# Patient Record
Sex: Female | Born: 1940 | Race: Black or African American | Hispanic: No | Marital: Single | Smoking: Never smoker
Health system: Southern US, Community
[De-identification: ages and names within clinical notes are randomized; demographics above are authoritative.]

## PROBLEM LIST (undated history)

## (undated) DIAGNOSIS — M545 Low back pain, unspecified: Secondary | ICD-10-CM

## (undated) DIAGNOSIS — I10 Essential (primary) hypertension: Secondary | ICD-10-CM

## (undated) DIAGNOSIS — M5430 Sciatica, unspecified side: Secondary | ICD-10-CM

## (undated) DIAGNOSIS — R002 Palpitations: Secondary | ICD-10-CM

## (undated) DIAGNOSIS — E7800 Pure hypercholesterolemia, unspecified: Secondary | ICD-10-CM

## (undated) DIAGNOSIS — R2689 Other abnormalities of gait and mobility: Secondary | ICD-10-CM

## (undated) DIAGNOSIS — M7582 Other shoulder lesions, left shoulder: Secondary | ICD-10-CM

## (undated) HISTORY — PX: ABDOMINAL HYSTERECTOMY: SHX81

---

## 2005-10-17 ENCOUNTER — Ambulatory Visit (HOSPITAL_COMMUNITY): Admission: RE | Admit: 2005-10-17 | Discharge: 2005-10-17 | Payer: Self-pay | Admitting: General Surgery

## 2005-12-18 ENCOUNTER — Ambulatory Visit (HOSPITAL_COMMUNITY): Admission: RE | Admit: 2005-12-18 | Discharge: 2005-12-18 | Payer: Self-pay | Admitting: Family Medicine

## 2006-01-06 ENCOUNTER — Ambulatory Visit: Payer: Self-pay | Admitting: Orthopedic Surgery

## 2006-02-17 ENCOUNTER — Ambulatory Visit: Payer: Self-pay | Admitting: Orthopedic Surgery

## 2006-03-04 ENCOUNTER — Ambulatory Visit (HOSPITAL_COMMUNITY): Admission: RE | Admit: 2006-03-04 | Discharge: 2006-03-04 | Payer: Self-pay | Admitting: Orthopedic Surgery

## 2006-08-18 ENCOUNTER — Ambulatory Visit: Payer: Self-pay | Admitting: Cardiology

## 2007-06-30 IMAGING — CR DG KNEE COMPLETE 4+V*R*
4 series · 4 of 4 positions shown · non-contrast
Comparison: none

HISTORY: Bilateral knee pain

LEFT KNEE 4 VIEWS:
Minimal joint space narrowing.
No fracture, dislocation, or bone destruction.
Patellar spur formation at quadriceps tendon and patellar tendon insertions.
Minimal spur formation lateral compartment.
No joint effusion.

[view not recorded (1 of 4)]
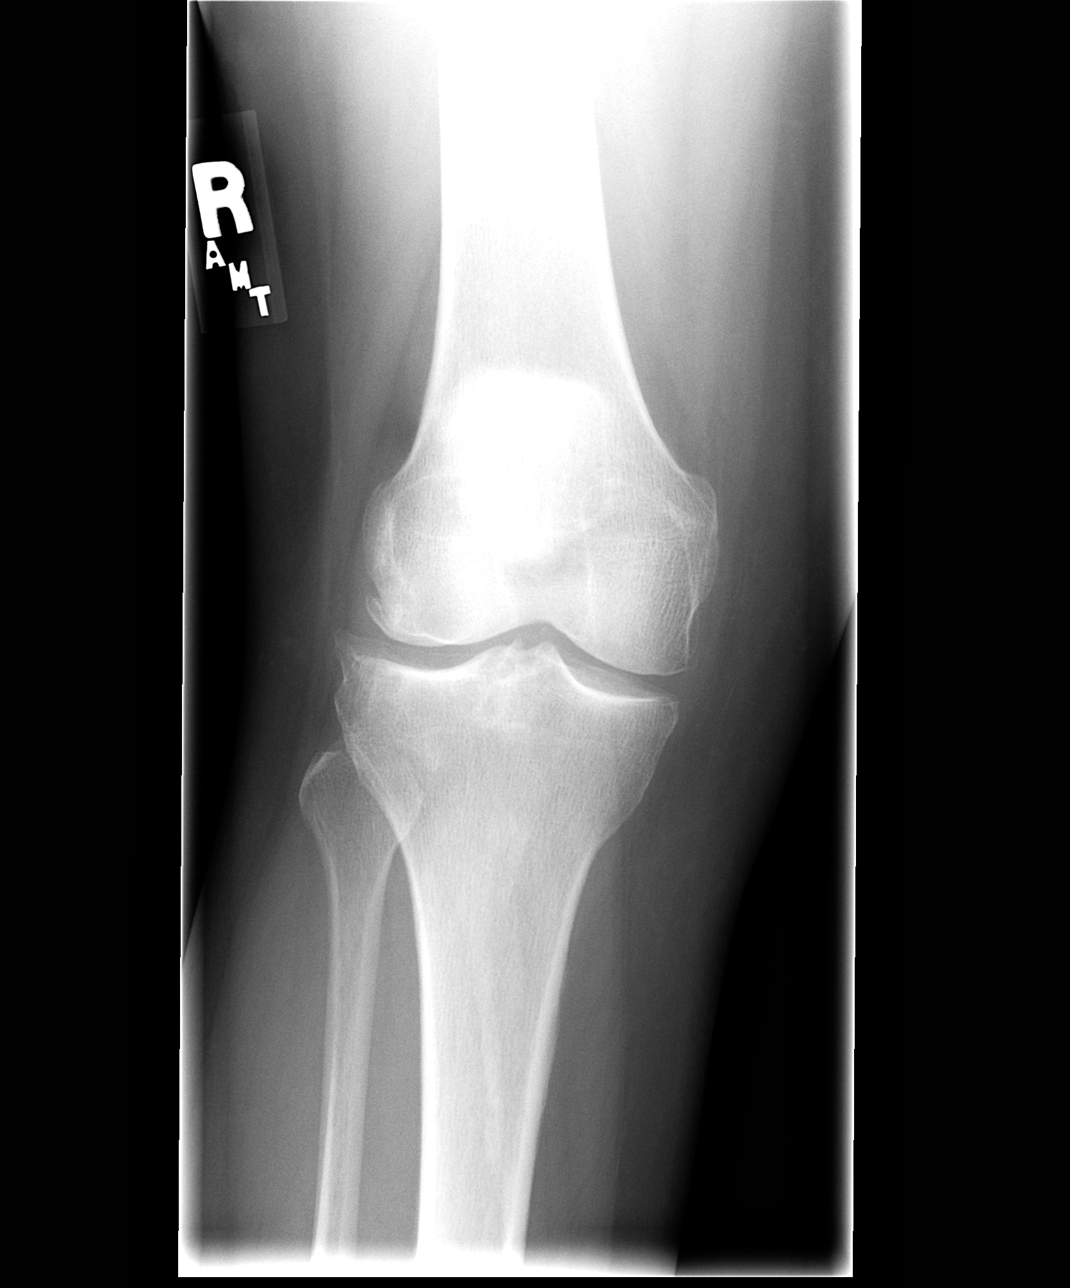

[view not recorded (2 of 4)]
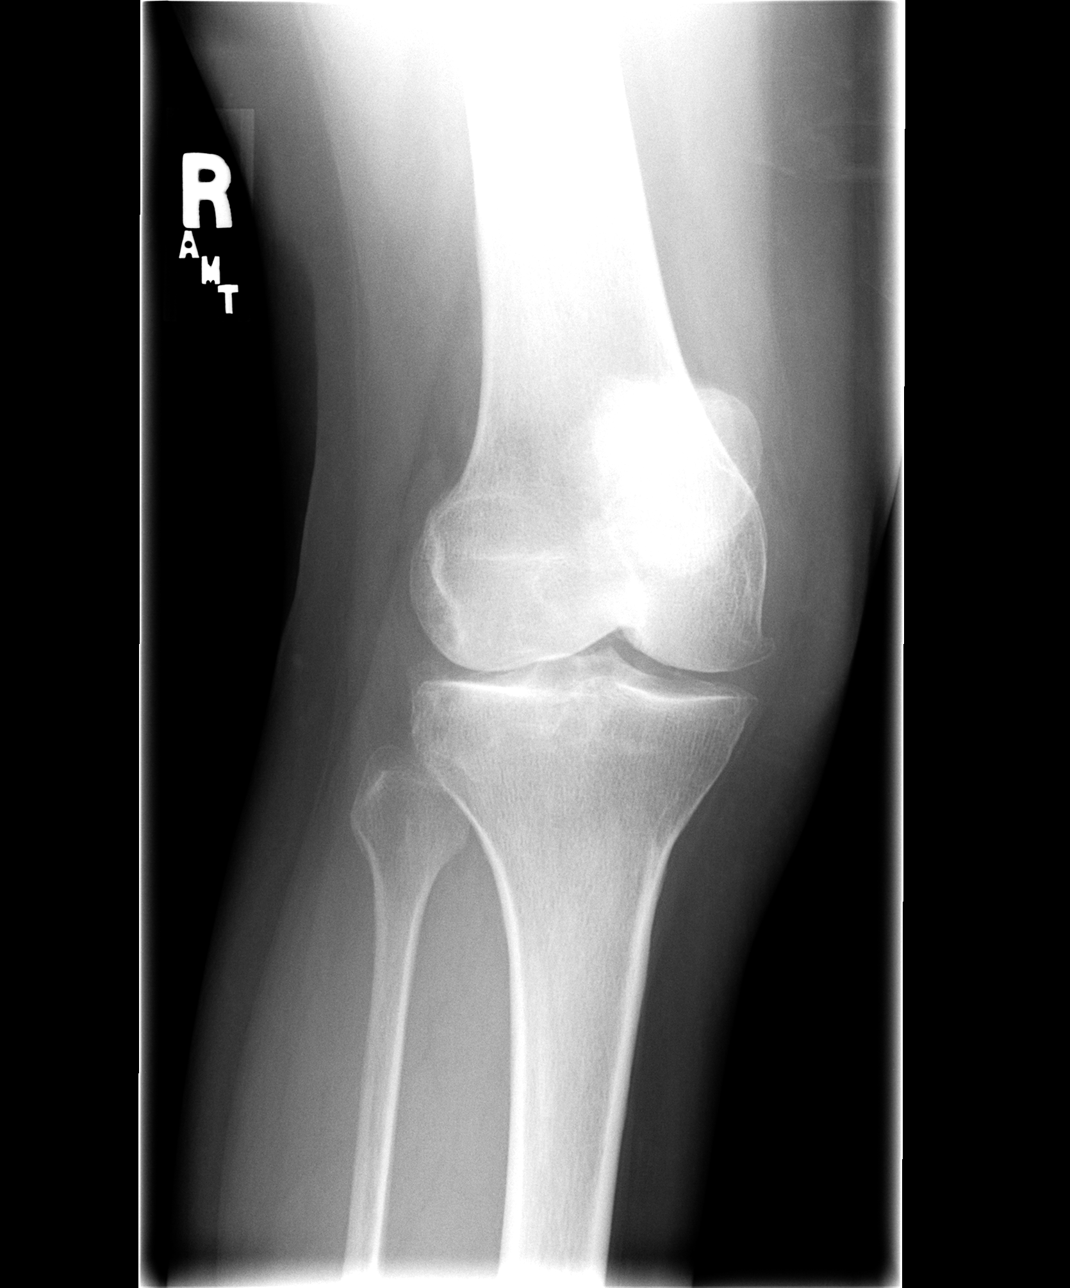

[view not recorded (3 of 4)]
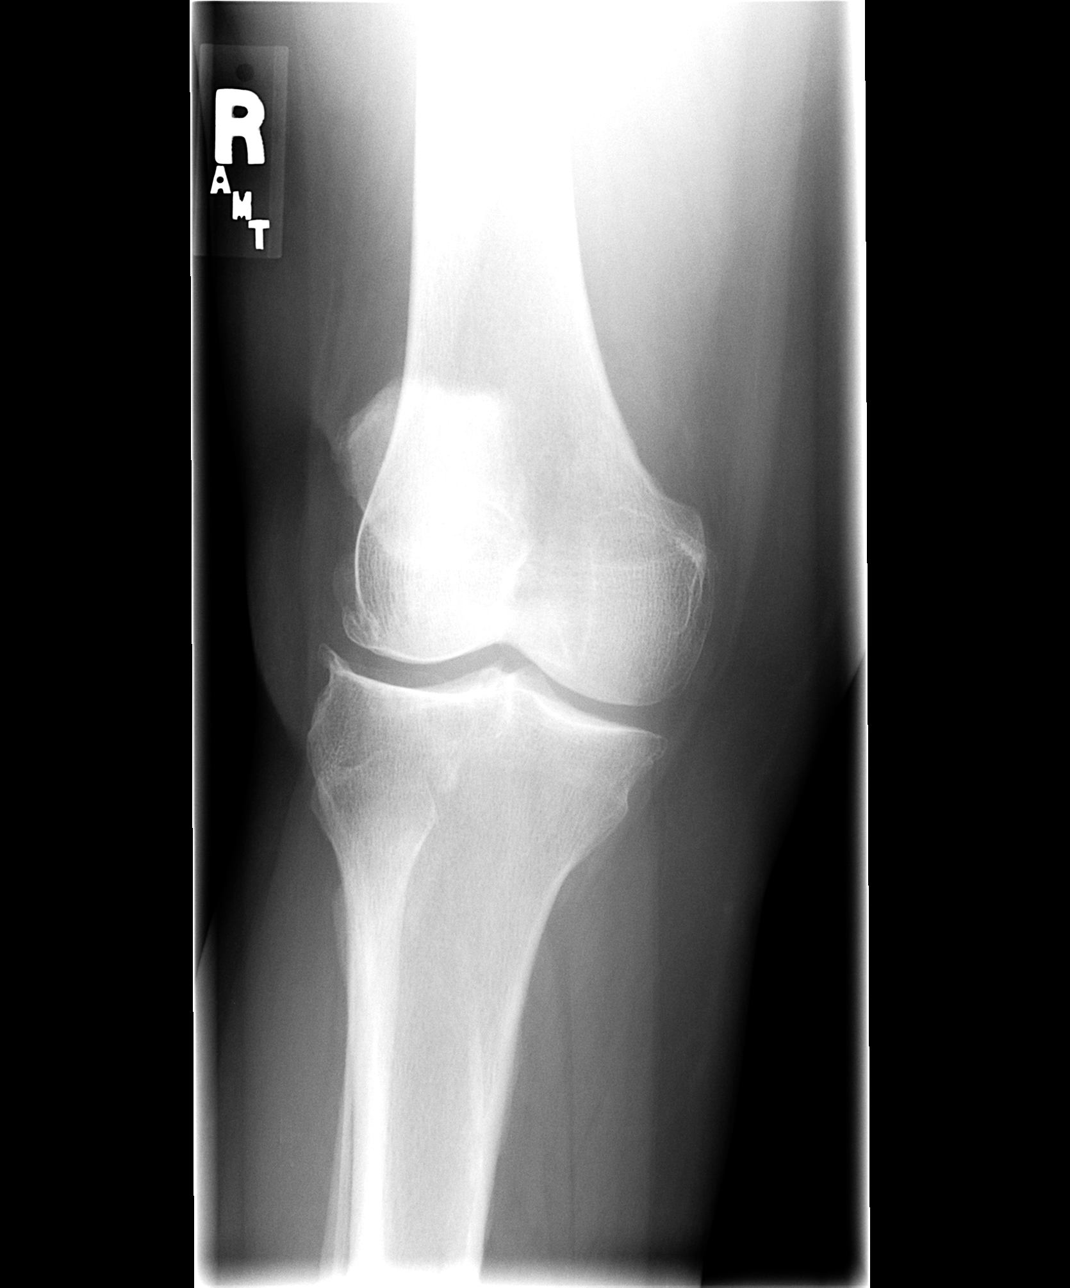

[view not recorded (4 of 4)]
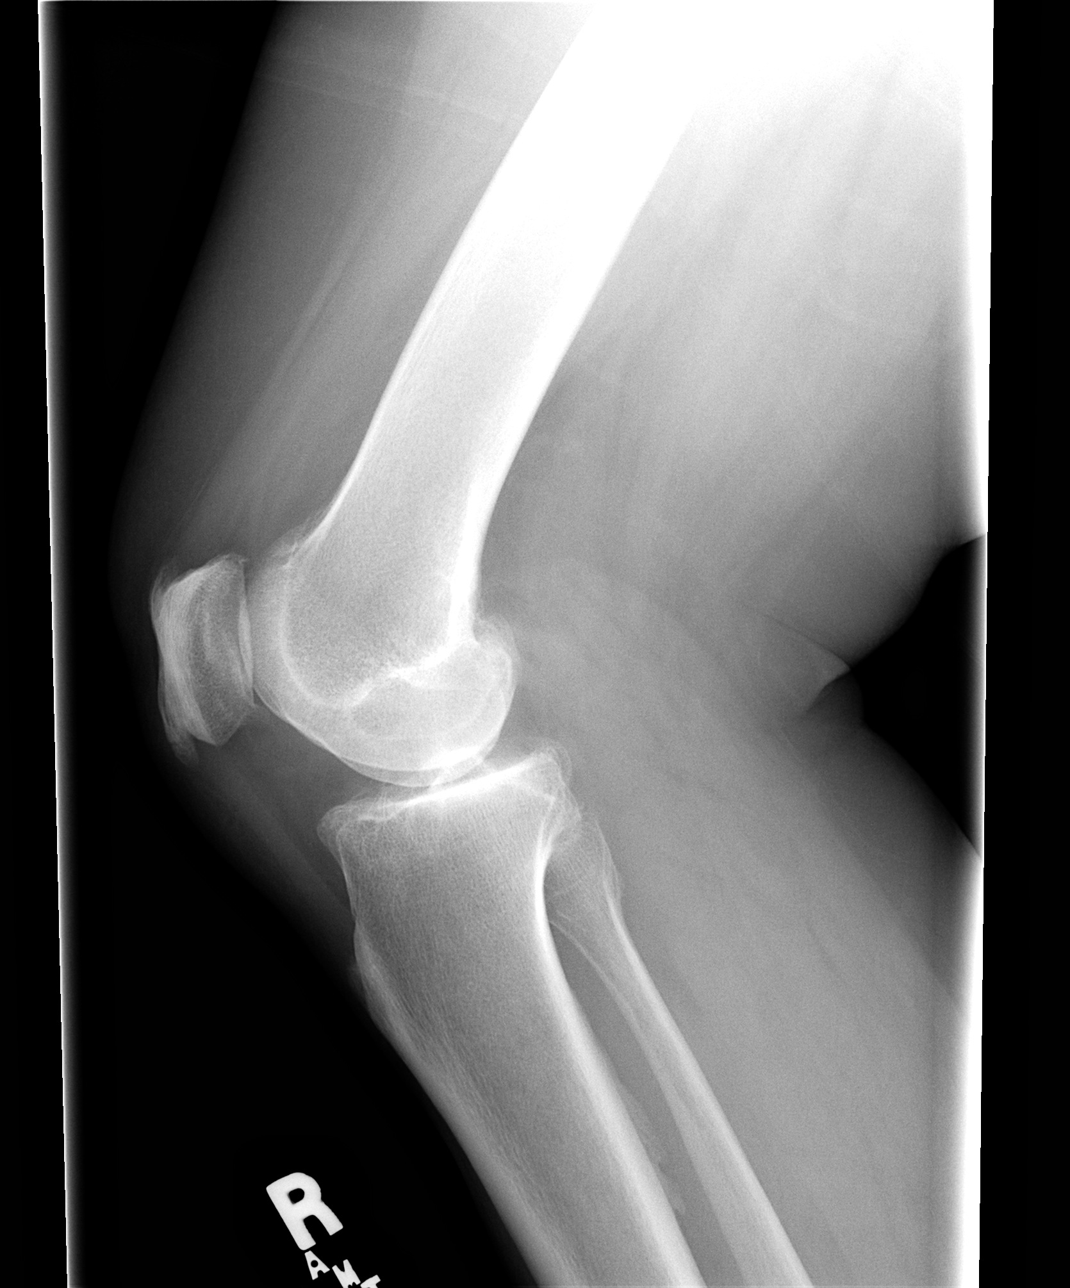

[4 of 4 positions shown; findings below may reference images not displayed]

IMPRESSION: Minimal degenerative changes left knee.

Right knee 4 views:

Osteoarthritic changes primarily at lateral compartment and patellofemoral
joint.
Patellar spur formation at quadriceps and patellar tendon insertions.
No fracture, dislocation or bone destruction.
No joint effusion seen.
IMPRESSION: Osteoarthritic changes right knee.

## 2007-09-24 ENCOUNTER — Encounter: Admission: RE | Admit: 2007-09-24 | Discharge: 2007-09-24 | Payer: Self-pay | Admitting: Orthopedic Surgery

## 2008-08-17 ENCOUNTER — Ambulatory Visit (HOSPITAL_COMMUNITY): Admission: RE | Admit: 2008-08-17 | Discharge: 2008-08-18 | Payer: Self-pay | Admitting: Orthopedic Surgery

## 2010-02-26 IMAGING — CR DG CHEST 2V
2 series · 2 of 2 positions shown · non-contrast
Comparison: None

CLINICAL DATA: Shoulder impingement and labral tear.  Preop
respiratory exam.

CHEST - 2 VIEW

[view not recorded (1 of 2)]
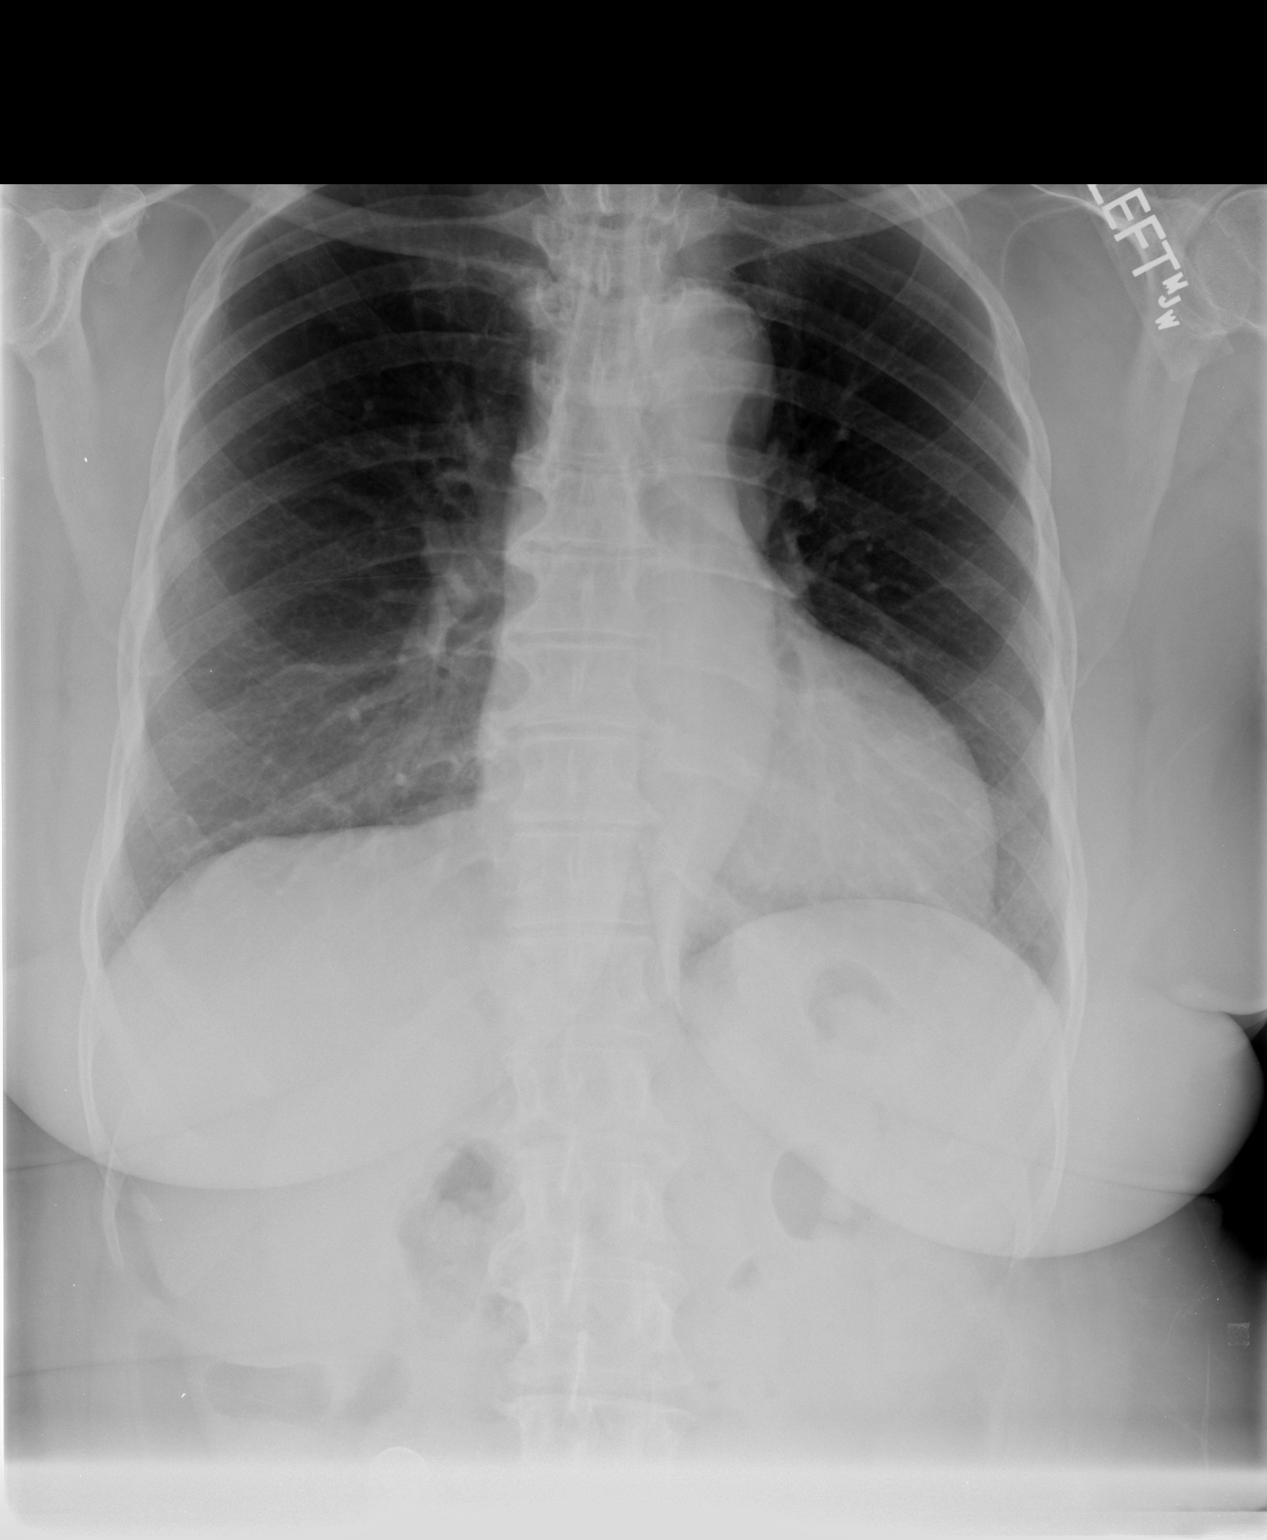

[view not recorded (2 of 2)]
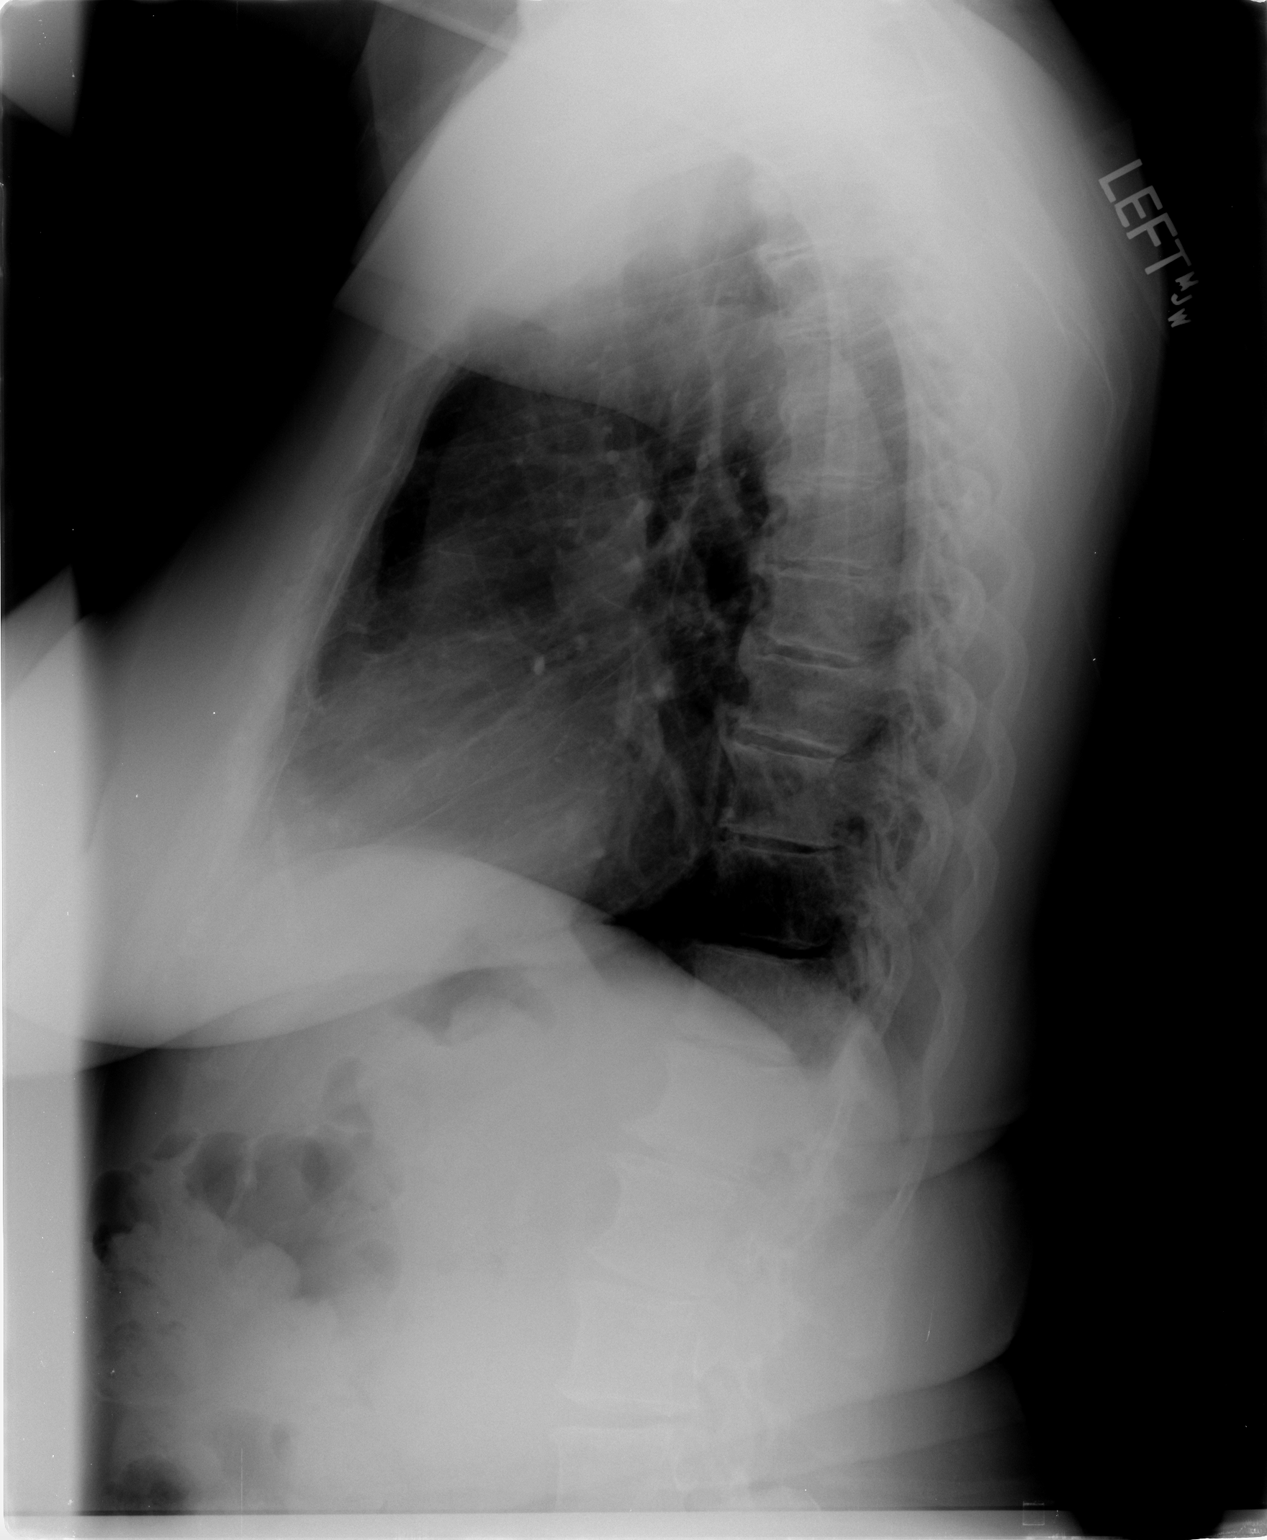

[2 of 2 positions shown; findings below may reference images not displayed]

FINDINGS: Mild cardiomegaly is noted.  Ectasia of the thoracic
aorta seen.  Both lungs are clear.  There is no evidence of pleural
effusion. No mass or adenopathy identified.
IMPRESSION: Mild cardiomegaly. No active disease.

## 2010-08-05 ENCOUNTER — Encounter: Payer: Self-pay | Admitting: Orthopedic Surgery

## 2010-10-30 LAB — CBC
Hemoglobin: 13.2 g/dL (ref 12.0–15.0)
MCV: 87.8 fL (ref 78.0–100.0)
Platelets: 296 10*3/uL (ref 150–400)
RBC: 4.63 MIL/uL (ref 3.87–5.11)
RDW: 16.5 % — ABNORMAL HIGH (ref 11.5–15.5)
WBC: 10.9 10*3/uL — ABNORMAL HIGH (ref 4.0–10.5)

## 2010-10-30 LAB — BASIC METABOLIC PANEL
Chloride: 103 mEq/L (ref 96–112)
Creatinine, Ser: 0.84 mg/dL (ref 0.4–1.2)
GFR calc non Af Amer: >60 mL/min (ref >60)
Glucose, Bld: 93 mg/dL (ref 70–99)
Sodium: 142 mEq/L (ref 135–145)

## 2010-10-30 LAB — URINALYSIS, ROUTINE W REFLEX MICROSCOPIC
Bilirubin Urine: NEGATIVE
Glucose, UA: NEGATIVE mg/dL
Hgb urine dipstick: NEGATIVE
Nitrite: NEGATIVE
Protein, ur: NEGATIVE mg/dL
pH: 7 (ref 5.0–8.0)

## 2010-10-30 LAB — URINE MICROSCOPIC-ADD ON

## 2010-11-27 NOTE — Op Note (Signed)
NAMEDECHELLE, ATTAWAY NO.:  [id number]   MEDICAL RECORD NO.:  [id number]          PATIENT TYPE:  AMB   LOCATION:  SDS                          FACILITY:  [facility]   PHYSICIAN:  Myrtie Neither, MD      DATE OF BIRTH:  [birth year]   DATE OF PROCEDURE:  08/17/2008  DATE OF DISCHARGE:                               OPERATIVE REPORT   PREOPERATIVE DIAGNOSES:  Impingement syndrome, right shoulder; labral  tear, right shoulder.   POSTOPERATIVE DIAGNOSES:  Loose bodies, right shoulder; degenerative  labral tear, right shoulder; impingement syndrome, right shoulder; and  synovitis, right shoulder.   ANESTHESIA:  General.   PROCEDURES:  1. Arthroscopic removal of loose bodies, right shoulder and      debridement of labrum.  2. Arthroscopic acromioplasty, decompression, and synovectomy, right      shoulder.   The patient was taken to the operating room and was given adequate preop  medications, given general anesthesia and was intubated.  Right shoulder  was prepped with DuraPrep and draped in a sterile manner.  The patient  was placed in barber-chair position.  A 1/2-inch puncture wound made  along the posterior aspect of the shoulder.  A switching rod was placed  in posterior to anterior.  Anterior inflow water incision was made.  Separate lateral incision was made with the shaver as well as  posterolaterally.  Inspection of the joint itself was done first.  Inspection revealed degenerative tear of the labrum approximately 12 and  1 o'clock position.  There were multiple loose osteochondral bodies  inside the joint.  The articular surface of the humeral head had some  chondral defect, the most part well preserved.  The glenoid itself had  some early degenerative joint changes.  With the use of the synovial  shaver, loose bodies were removed from the joint itself and debridement  along the edges of the labrum.  Next, inspection of the subacromial  space was done.  Rotator  cuff revealed an significant tendopathy, but no  full-thickness tear of the rotator cuff.  Complete synovectomy was done  with acromioplasty with use of a bur.  After adequate decompression and  acromioplasty, wound closure was then done with 4-0 nylon, 14 mL of 1%  plain Marcaine was injected into the area.  Compressive dressing was  applied and sling applied.  The patient tolerated procedure quite well  and taken to recovery room in stable and satisfactory condition.  The  patient being kept 23 hours for observation and pain control.  The  patient will be discharged on Percocet 1 to 2 q.6 h. p.r.n. for pain.  Continue ice packs and to return to the office in 1 week.      Myrtie Neither, MD  Electronically Signed     AC/MEDQ  D:  08/17/2008  T:  08/17/2008  Job:  [id number]

## 2010-11-30 NOTE — H&P (Signed)
Abigail Frazier, Abigail Frazier           ACCOUNT NO.:  [id number]   MEDICAL RECORD NO.:  [id number]          PATIENT TYPE:  AMB   LOCATION:                                FACILITY:  [facility]   PHYSICIAN:  Dirk Dress. Katrinka Blazing, M.D.   DATE OF BIRTH:  06-18-1941   DATE OF ADMISSION:  DATE OF DISCHARGE:  LH                                HISTORY & PHYSICAL   70 year old female with a history of dysphagia to solid and breads,  difficulty with liquids, she has been symptomatic for over two months.  She  has had heartburn for many years which she had treated with over-the-counter  medications.  She denies nausea and vomiting, there has been no weight loss  and no abdominal pain.   PAST MEDICAL HISTORY:  Hypertension, glaucoma, gastroesophageal reflux  disease.   MEDICATIONS:  Lisinopril/HCT 20/12.5 daily, Reglan 10 mg a.c. and h.s.,  Prevacid 30 mg daily.   PAST SURGICAL HISTORY:  Thyroidectomy, hysterectomy, appendectomy.   PHYSICAL EXAMINATION:  VITAL SIGNS:  Blood pressure 150/92, pulse 62, respirations 20, weight 166  pounds.  HEENT:  Unremarkable.  NECK:  Supple, no JVD, bruit, adenopathy, or thyromegaly.  CHEST:  Clear to auscultation.  HEART:  Regular rate and rhythm without murmurs, gallops, and rubs.  ABDOMEN:  Soft, nontender, no masses.  EXTREMITIES:  No cyanosis, clubbing, and edema.  NEUROLOGICAL:  No focal motor, sensory, or cerebellar deficit.   IMPRESSION:  1.  Gastroesophageal reflux disease.  2.  History suggestive of esophagitis.  3.  Progressive dysphagia.  4.  Hypertension.  5.  Glaucoma.   PLAN:  Upper endoscopy with possible esophageal dilatation.      Dirk Dress. Katrinka Blazing, M.D.  Electronically Signed    LCS/MEDQ  D:  10/16/2005  T:  10/16/2005  Job:  [id number]

## 2012-11-11 ENCOUNTER — Ambulatory Visit (INDEPENDENT_AMBULATORY_CARE_PROVIDER_SITE_OTHER): Payer: 59 | Admitting: *Deleted

## 2012-11-11 DIAGNOSIS — I4892 Unspecified atrial flutter: Secondary | ICD-10-CM

## 2014-09-13 DIAGNOSIS — I10 Essential (primary) hypertension: Secondary | ICD-10-CM | POA: Diagnosis not present

## 2014-09-13 DIAGNOSIS — E785 Hyperlipidemia, unspecified: Secondary | ICD-10-CM | POA: Diagnosis not present

## 2014-09-13 DIAGNOSIS — M1711 Unilateral primary osteoarthritis, right knee: Secondary | ICD-10-CM | POA: Diagnosis not present

## 2014-10-14 DIAGNOSIS — H4011X3 Primary open-angle glaucoma, severe stage: Secondary | ICD-10-CM | POA: Diagnosis not present

## 2014-10-14 DIAGNOSIS — H2511 Age-related nuclear cataract, right eye: Secondary | ICD-10-CM | POA: Diagnosis not present

## 2014-11-20 DIAGNOSIS — R011 Cardiac murmur, unspecified: Secondary | ICD-10-CM | POA: Diagnosis not present

## 2014-11-20 DIAGNOSIS — I10 Essential (primary) hypertension: Secondary | ICD-10-CM | POA: Diagnosis not present

## 2014-11-20 DIAGNOSIS — J9691 Respiratory failure, unspecified with hypoxia: Secondary | ICD-10-CM | POA: Diagnosis not present

## 2014-11-20 DIAGNOSIS — Z8701 Personal history of pneumonia (recurrent): Secondary | ICD-10-CM | POA: Diagnosis not present

## 2014-11-20 DIAGNOSIS — J42 Unspecified chronic bronchitis: Secondary | ICD-10-CM | POA: Diagnosis not present

## 2014-11-20 DIAGNOSIS — H409 Unspecified glaucoma: Secondary | ICD-10-CM | POA: Diagnosis not present

## 2014-11-20 DIAGNOSIS — R05 Cough: Secondary | ICD-10-CM | POA: Diagnosis not present

## 2014-11-20 DIAGNOSIS — K219 Gastro-esophageal reflux disease without esophagitis: Secondary | ICD-10-CM | POA: Diagnosis not present

## 2014-11-20 DIAGNOSIS — Z7982 Long term (current) use of aspirin: Secondary | ICD-10-CM | POA: Diagnosis not present

## 2014-11-20 DIAGNOSIS — R0602 Shortness of breath: Secondary | ICD-10-CM | POA: Diagnosis not present

## 2014-11-20 DIAGNOSIS — Z79899 Other long term (current) drug therapy: Secondary | ICD-10-CM | POA: Diagnosis not present

## 2014-11-20 DIAGNOSIS — R918 Other nonspecific abnormal finding of lung field: Secondary | ICD-10-CM | POA: Diagnosis not present

## 2014-11-20 DIAGNOSIS — J189 Pneumonia, unspecified organism: Secondary | ICD-10-CM | POA: Diagnosis not present

## 2014-11-21 DIAGNOSIS — Z7982 Long term (current) use of aspirin: Secondary | ICD-10-CM | POA: Diagnosis not present

## 2014-11-21 DIAGNOSIS — H409 Unspecified glaucoma: Secondary | ICD-10-CM | POA: Diagnosis not present

## 2014-11-21 DIAGNOSIS — Z8701 Personal history of pneumonia (recurrent): Secondary | ICD-10-CM | POA: Diagnosis not present

## 2014-11-21 DIAGNOSIS — Z79899 Other long term (current) drug therapy: Secondary | ICD-10-CM | POA: Diagnosis not present

## 2014-11-21 DIAGNOSIS — K219 Gastro-esophageal reflux disease without esophagitis: Secondary | ICD-10-CM | POA: Diagnosis not present

## 2014-11-21 DIAGNOSIS — J189 Pneumonia, unspecified organism: Secondary | ICD-10-CM | POA: Diagnosis not present

## 2014-11-21 DIAGNOSIS — I10 Essential (primary) hypertension: Secondary | ICD-10-CM | POA: Diagnosis not present

## 2014-11-21 DIAGNOSIS — J42 Unspecified chronic bronchitis: Secondary | ICD-10-CM | POA: Diagnosis not present

## 2014-11-22 DIAGNOSIS — K219 Gastro-esophageal reflux disease without esophagitis: Secondary | ICD-10-CM | POA: Diagnosis not present

## 2014-11-22 DIAGNOSIS — J42 Unspecified chronic bronchitis: Secondary | ICD-10-CM | POA: Diagnosis not present

## 2014-11-22 DIAGNOSIS — H409 Unspecified glaucoma: Secondary | ICD-10-CM | POA: Diagnosis not present

## 2014-11-22 DIAGNOSIS — Z8701 Personal history of pneumonia (recurrent): Secondary | ICD-10-CM | POA: Diagnosis not present

## 2014-11-22 DIAGNOSIS — Z79899 Other long term (current) drug therapy: Secondary | ICD-10-CM | POA: Diagnosis not present

## 2014-11-22 DIAGNOSIS — J189 Pneumonia, unspecified organism: Secondary | ICD-10-CM | POA: Diagnosis not present

## 2014-11-22 DIAGNOSIS — I10 Essential (primary) hypertension: Secondary | ICD-10-CM | POA: Diagnosis not present

## 2014-11-22 DIAGNOSIS — Z7982 Long term (current) use of aspirin: Secondary | ICD-10-CM | POA: Diagnosis not present

## 2014-12-14 DIAGNOSIS — I10 Essential (primary) hypertension: Secondary | ICD-10-CM | POA: Diagnosis not present

## 2014-12-14 DIAGNOSIS — E785 Hyperlipidemia, unspecified: Secondary | ICD-10-CM | POA: Diagnosis not present

## 2015-01-18 DIAGNOSIS — J189 Pneumonia, unspecified organism: Secondary | ICD-10-CM | POA: Diagnosis not present

## 2015-01-18 DIAGNOSIS — I10 Essential (primary) hypertension: Secondary | ICD-10-CM | POA: Diagnosis not present

## 2015-02-20 DIAGNOSIS — H4011X3 Primary open-angle glaucoma, severe stage: Secondary | ICD-10-CM | POA: Diagnosis not present

## 2015-02-20 DIAGNOSIS — H269 Unspecified cataract: Secondary | ICD-10-CM | POA: Diagnosis not present

## 2015-02-20 DIAGNOSIS — H2511 Age-related nuclear cataract, right eye: Secondary | ICD-10-CM | POA: Diagnosis not present

## 2015-02-20 DIAGNOSIS — H25811 Combined forms of age-related cataract, right eye: Secondary | ICD-10-CM | POA: Diagnosis not present

## 2015-02-27 DIAGNOSIS — Z1231 Encounter for screening mammogram for malignant neoplasm of breast: Secondary | ICD-10-CM | POA: Diagnosis not present

## 2015-06-20 DIAGNOSIS — H401133 Primary open-angle glaucoma, bilateral, severe stage: Secondary | ICD-10-CM | POA: Diagnosis not present

## 2015-06-20 DIAGNOSIS — H04123 Dry eye syndrome of bilateral lacrimal glands: Secondary | ICD-10-CM | POA: Diagnosis not present

## 2015-07-05 DIAGNOSIS — Z6830 Body mass index (BMI) 30.0-30.9, adult: Secondary | ICD-10-CM | POA: Diagnosis not present

## 2015-07-05 DIAGNOSIS — I10 Essential (primary) hypertension: Secondary | ICD-10-CM | POA: Diagnosis not present

## 2015-07-05 DIAGNOSIS — M1990 Unspecified osteoarthritis, unspecified site: Secondary | ICD-10-CM | POA: Diagnosis not present

## 2015-10-06 DIAGNOSIS — H43813 Vitreous degeneration, bilateral: Secondary | ICD-10-CM | POA: Diagnosis not present

## 2015-10-06 DIAGNOSIS — H401133 Primary open-angle glaucoma, bilateral, severe stage: Secondary | ICD-10-CM | POA: Diagnosis not present

## 2015-11-07 DIAGNOSIS — H401110 Primary open-angle glaucoma, right eye, stage unspecified: Secondary | ICD-10-CM | POA: Diagnosis not present

## 2016-02-13 DIAGNOSIS — M1711 Unilateral primary osteoarthritis, right knee: Secondary | ICD-10-CM | POA: Diagnosis not present

## 2016-02-13 DIAGNOSIS — E785 Hyperlipidemia, unspecified: Secondary | ICD-10-CM | POA: Diagnosis not present

## 2016-02-13 DIAGNOSIS — I10 Essential (primary) hypertension: Secondary | ICD-10-CM | POA: Diagnosis not present

## 2016-03-04 DIAGNOSIS — Z1231 Encounter for screening mammogram for malignant neoplasm of breast: Secondary | ICD-10-CM | POA: Diagnosis not present

## 2016-08-09 DIAGNOSIS — W010XXA Fall on same level from slipping, tripping and stumbling without subsequent striking against object, initial encounter: Secondary | ICD-10-CM | POA: Diagnosis not present

## 2016-08-09 DIAGNOSIS — I10 Essential (primary) hypertension: Secondary | ICD-10-CM | POA: Diagnosis not present

## 2016-08-09 DIAGNOSIS — Z7982 Long term (current) use of aspirin: Secondary | ICD-10-CM | POA: Diagnosis not present

## 2016-08-09 DIAGNOSIS — M1711 Unilateral primary osteoarthritis, right knee: Secondary | ICD-10-CM | POA: Diagnosis not present

## 2016-08-09 DIAGNOSIS — K219 Gastro-esophageal reflux disease without esophagitis: Secondary | ICD-10-CM | POA: Diagnosis not present

## 2016-08-09 DIAGNOSIS — Z79899 Other long term (current) drug therapy: Secondary | ICD-10-CM | POA: Diagnosis not present

## 2016-08-09 DIAGNOSIS — S8001XA Contusion of right knee, initial encounter: Secondary | ICD-10-CM | POA: Diagnosis not present

## 2016-08-09 DIAGNOSIS — M7989 Other specified soft tissue disorders: Secondary | ICD-10-CM | POA: Diagnosis not present

## 2016-08-15 DIAGNOSIS — H401131 Primary open-angle glaucoma, bilateral, mild stage: Secondary | ICD-10-CM | POA: Diagnosis not present

## 2016-08-21 DIAGNOSIS — J209 Acute bronchitis, unspecified: Secondary | ICD-10-CM | POA: Diagnosis not present

## 2016-08-21 DIAGNOSIS — R0602 Shortness of breath: Secondary | ICD-10-CM | POA: Diagnosis not present

## 2016-08-21 DIAGNOSIS — Z7982 Long term (current) use of aspirin: Secondary | ICD-10-CM | POA: Diagnosis not present

## 2016-08-21 DIAGNOSIS — K219 Gastro-esophageal reflux disease without esophagitis: Secondary | ICD-10-CM | POA: Diagnosis not present

## 2016-08-21 DIAGNOSIS — Z79899 Other long term (current) drug therapy: Secondary | ICD-10-CM | POA: Diagnosis not present

## 2016-08-21 DIAGNOSIS — R05 Cough: Secondary | ICD-10-CM | POA: Diagnosis not present

## 2016-08-21 DIAGNOSIS — I10 Essential (primary) hypertension: Secondary | ICD-10-CM | POA: Diagnosis not present

## 2016-09-05 DIAGNOSIS — H401132 Primary open-angle glaucoma, bilateral, moderate stage: Secondary | ICD-10-CM | POA: Diagnosis not present

## 2016-10-01 DIAGNOSIS — I10 Essential (primary) hypertension: Secondary | ICD-10-CM | POA: Diagnosis not present

## 2016-10-01 DIAGNOSIS — E785 Hyperlipidemia, unspecified: Secondary | ICD-10-CM | POA: Diagnosis not present

## 2016-10-02 ENCOUNTER — Telehealth: Payer: Self-pay | Admitting: *Deleted

## 2016-10-02 DIAGNOSIS — M1711 Unilateral primary osteoarthritis, right knee: Secondary | ICD-10-CM | POA: Diagnosis not present

## 2016-10-02 DIAGNOSIS — I10 Essential (primary) hypertension: Secondary | ICD-10-CM | POA: Diagnosis not present

## 2016-10-02 NOTE — Telephone Encounter (Signed)
-----   Message from Orinda Kenner sent at 10/02/2016  2:47 PM EDT ----- Monday March 26th register @ 12:45 @ [facility]tr[facility].Marina Gravel, Coralyn Mark ----- Message ----- From: Massie Maroon, CMA Sent: 10/02/2016  11:24 AM To: Terry L Goins  Morning,   Nikki asked me to send this pt info about ordering 48 holter monitor for palpitations per Dr Berdine Addison from [facility]. I can call the pt when you get it scheduled. Thank you  Farah Benish

## 2016-10-02 NOTE — Telephone Encounter (Signed)
Sherri from Dr [employer] office made aware - also pt made aware of date/time - Sherri will fax order for holter and we will forward to [place] office.

## 2016-10-07 ENCOUNTER — Ambulatory Visit (HOSPITAL_COMMUNITY)
Admission: RE | Admit: 2016-10-07 | Discharge: 2016-10-07 | Disposition: A | Discharge status: Home / Self Care | Payer: Medicare Other | Source: Ambulatory Visit | Attending: Family Medicine | Admitting: Family Medicine

## 2016-10-07 DIAGNOSIS — I4892 Unspecified atrial flutter: Secondary | ICD-10-CM | POA: Insufficient documentation

## 2016-10-07 DIAGNOSIS — R002 Palpitations: Secondary | ICD-10-CM

## 2016-10-08 DIAGNOSIS — H401133 Primary open-angle glaucoma, bilateral, severe stage: Secondary | ICD-10-CM | POA: Diagnosis not present

## 2016-11-19 DIAGNOSIS — H401133 Primary open-angle glaucoma, bilateral, severe stage: Secondary | ICD-10-CM | POA: Diagnosis not present

## 2016-12-25 DIAGNOSIS — H401133 Primary open-angle glaucoma, bilateral, severe stage: Secondary | ICD-10-CM | POA: Diagnosis not present

## 2016-12-31 DIAGNOSIS — I10 Essential (primary) hypertension: Secondary | ICD-10-CM | POA: Diagnosis not present

## 2016-12-31 DIAGNOSIS — K219 Gastro-esophageal reflux disease without esophagitis: Secondary | ICD-10-CM | POA: Diagnosis not present

## 2017-03-06 DIAGNOSIS — Z1231 Encounter for screening mammogram for malignant neoplasm of breast: Secondary | ICD-10-CM | POA: Diagnosis not present

## 2017-04-22 DIAGNOSIS — Z0000 Encounter for general adult medical examination without abnormal findings: Secondary | ICD-10-CM | POA: Diagnosis not present

## 2017-07-28 DIAGNOSIS — E785 Hyperlipidemia, unspecified: Secondary | ICD-10-CM | POA: Diagnosis not present

## 2017-07-28 DIAGNOSIS — K219 Gastro-esophageal reflux disease without esophagitis: Secondary | ICD-10-CM | POA: Diagnosis not present

## 2017-07-28 DIAGNOSIS — I10 Essential (primary) hypertension: Secondary | ICD-10-CM | POA: Diagnosis not present

## 2017-08-19 DIAGNOSIS — H524 Presbyopia: Secondary | ICD-10-CM | POA: Diagnosis not present

## 2017-08-19 DIAGNOSIS — H401131 Primary open-angle glaucoma, bilateral, mild stage: Secondary | ICD-10-CM | POA: Diagnosis not present

## 2017-09-11 DIAGNOSIS — Z961 Presence of intraocular lens: Secondary | ICD-10-CM | POA: Diagnosis not present

## 2017-09-11 DIAGNOSIS — H401133 Primary open-angle glaucoma, bilateral, severe stage: Secondary | ICD-10-CM | POA: Diagnosis not present

## 2017-10-08 DIAGNOSIS — H409 Unspecified glaucoma: Secondary | ICD-10-CM | POA: Diagnosis not present

## 2017-10-08 DIAGNOSIS — H401113 Primary open-angle glaucoma, right eye, severe stage: Secondary | ICD-10-CM | POA: Diagnosis not present

## 2017-10-13 HISTORY — PX: GLAUCOMA SURGERY: SHX656

## 2017-11-06 DIAGNOSIS — R42 Dizziness and giddiness: Secondary | ICD-10-CM | POA: Diagnosis not present

## 2017-11-06 DIAGNOSIS — E785 Hyperlipidemia, unspecified: Secondary | ICD-10-CM | POA: Diagnosis not present

## 2017-11-06 DIAGNOSIS — I10 Essential (primary) hypertension: Secondary | ICD-10-CM | POA: Diagnosis not present

## 2017-11-24 ENCOUNTER — Telehealth (HOSPITAL_COMMUNITY): Payer: Self-pay | Admitting: Family Medicine

## 2017-11-24 NOTE — Telephone Encounter (Signed)
11/24/17  Pt called and she said she doesn't think she has vertigo she said that she thought it was her glasses.  She says that she does stagger a little but thinks that is because of her age.  She is going to talk with her children to see what they think and she said she would call back.

## 2017-11-25 DIAGNOSIS — J069 Acute upper respiratory infection, unspecified: Secondary | ICD-10-CM | POA: Diagnosis not present

## 2017-11-25 DIAGNOSIS — I10 Essential (primary) hypertension: Secondary | ICD-10-CM | POA: Diagnosis not present

## 2017-11-25 DIAGNOSIS — R05 Cough: Secondary | ICD-10-CM | POA: Diagnosis not present

## 2017-11-25 DIAGNOSIS — K219 Gastro-esophageal reflux disease without esophagitis: Secondary | ICD-10-CM | POA: Diagnosis not present

## 2017-11-25 DIAGNOSIS — Z7982 Long term (current) use of aspirin: Secondary | ICD-10-CM | POA: Diagnosis not present

## 2017-11-25 DIAGNOSIS — Z79899 Other long term (current) drug therapy: Secondary | ICD-10-CM | POA: Diagnosis not present

## 2017-11-25 DIAGNOSIS — R509 Fever, unspecified: Secondary | ICD-10-CM | POA: Diagnosis not present

## 2017-12-01 ENCOUNTER — Other Ambulatory Visit: Payer: Self-pay

## 2017-12-01 ENCOUNTER — Encounter (HOSPITAL_COMMUNITY): Payer: Self-pay

## 2017-12-01 ENCOUNTER — Ambulatory Visit (HOSPITAL_COMMUNITY): Payer: Medicare Other | Attending: Family Medicine

## 2017-12-01 DIAGNOSIS — M6281 Muscle weakness (generalized): Secondary | ICD-10-CM | POA: Diagnosis not present

## 2017-12-01 DIAGNOSIS — R2689 Other abnormalities of gait and mobility: Secondary | ICD-10-CM | POA: Diagnosis not present

## 2017-12-01 NOTE — Patient Instructions (Signed)
Supine Bridge REPS: 10-15  SETS: 1-3  HOLD: 3 seconds  WEEKLY: 7x  DAILY: 1x Setup Begin lying on your back with your arms resting at your sides, your legs bent at the knees and your feet flat on the ground. Movement Tighten your abdominals and slowly lift your hips off the floor into a bridge position, keeping your back straight. Tip Make sure to keep your trunk stiff throughout the exercise and your arms flat on the floor. STEP 1 STEP 2 Supine Active Straight Leg Raise REPS: 10-15  SETS: 1-3  WEEKLY: 7x  DAILY: 1x Setup Begin lying on your back with one knee bent and your other leg straight. Movement Engaging your thigh muscles, slowly lift your straight leg until it is parallel with your other thigh, then lower it back to the starting position and repeat. Tip Make sure to keep your leg straight and do not let your back arch during the exercise.

## 2017-12-01 NOTE — Therapy (Signed)
[place], [place], [address] Phone: [phone]   Fax:  [phone]  Physical Therapy Evaluation  Patient Details  Name: Abigail Frazier MRN: [id number] Date of Birth: May 19, 1941 Referring Provider: Lucianne Lei, MD   Encounter Date: 12/01/2017    12/01/17 1429  PT Visits / Re-Eval  Visit Number 1  Number of Visits 9  Date for PT Re-Evaluation 12/29/17  Authorization  Authorization Type [place] [employer] (no auth required, visits based on medical necessity)  Authorization Time Period 12/01/17 - 12/29/17  Authorization - Visit Number 1  Authorization - Number of Visits 10  PT Time Calculation  PT Start Time 1351  PT Stop Time 1431  PT Time Calculation (min) 40 min  PT - End of Session  Equipment Utilized During Treatment Gait belt  Activity Tolerance Patient tolerated treatment well  Behavior During Therapy [facility] for tasks assessed/performed    History reviewed. No pertinent past medical history.  History reviewed. No pertinent surgical history.  There were no vitals filed for this visit.       12/01/17 1433  Symptoms/Limitations  Subjective Patient reports she was sent here for balance training by her doctor but she does not feel like she has had any trouble recently. She reports she did have one episode of dizziness when getting up from laying down but that she has not had any more. She also thinks she is having some trouble with seeing because her bifocals and is going to talk to her eye doctor this week. She reports occasionally she get floaters in her eyes but denies double vision or blurry vision. She reports about 1 month ago she had surgery for glaucoma on her left eye and is planning to have it on her right in the next few months. She has her follow up on this Thursday. She states she has never used an assistive device to walk and denies falls. She reports she did have difficulty walking  with one foot in front of the other at the doctors office and with the balance testing Dr. Criss Rosales gave her. She states Dr. Criss Rosales told her it would be good to have PT to check out her dizziness and balance.   Pertinent History eye surgery Lt for glaucoma ~ April 2019  Limitations Walking;House hold activities  Pain Assessment  Currently in Pain? No/denies        12/01/17 0001  Assessment  Medical Diagnosis Balance Disorder; Dizziness  Referring Provider Lucianne Lei, MD  Onset Date/Surgical Date  (unsure, patient had 1 episode of dizziness, MD suggested PT)  Prior Therapy No  Precautions  Precautions None  Restrictions  Weight Bearing Restrictions No  Balance Screen  Has the patient fallen in the past 6 months No  Has the patient had a decrease in activity level because of a fear of falling?  No  Is the patient reluctant to leave their home because of a fear of falling?  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  Cognition  Overall Cognitive Status Within Functional Limits for tasks assessed  Observation/Other Assessments  Focus on Therapeutic Outcomes (FOTO)   (perform next session)  Sensation  Light Touch Appears Intact  Functional Tests  Functional tests Single leg stance  Single Leg Stance  Comments <5 seconds bil LE  Posture/Postural Control  Posture/Postural Control No significant limitations  ROM / Strength  AROM / PROM / Strength Strength  Strength   Strength Assessment Site Hip;Knee;Ankle  Right/Left Knee Right;Left  Right Hip Flexion 4/5  Right Hip Extension 3+/5  Right Hip ABduction 4/5  Left Hip Flexion 4+/5  Left Hip Extension 3+/5  Left Hip ABduction 4-/5  Right Knee Flexion 4+/5  Right Knee Extension 5/5  Left Knee Flexion 4/5  Left Knee Extension 5/5  Right Ankle Dorsiflexion 5/5  Left Ankle Dorsiflexion 5/5  Standardized Balance Assessment  Standardized Balance Assessment Dynamic Gait Index  Dynamic Gait Index  Level Surface 3  Change in Gait Speed 3  Gait with Horizontal Head Turns 2  Gait with Vertical Head Turns 2  Gait and Pivot Turn 2  Step Over Obstacle 2  Step Around Obstacles 2  Steps 1  Total Score 17  DGI comment: 17/24 = indicates patient is at risk for falling     Objective measurements completed on examination: See above findings.       12/01/17 0001  Posture/Postural Control  Posture/Postural Control No significant limitations  Knee/Hip Exercises: Supine  Bridges Strengthening;1 set;Both;10 reps  Straight Leg Raises Strengthening;Both;1 set;10 reps   [facility] Adult PT Treatment/Exercise - 12/03/17 0001      Exercises   Exercises  Knee/Hip          12/01/17 1528  PT Education  Education provided Yes  Education Details Educated on exam findings and on initial HEP for hip strengthening.  Person(s) Educated Patient  Methods Explanation;Handout  Comprehension Verbalized understanding      PT Short Term Goals - 12/01/17 1542      PT SHORT TERM GOAL #1   Title  Patient will be independent with HEP to improve functional strength to reduce risk of falls and improve mobility/gait.    Time  2    Period  Weeks    Status  New    Target Date  12/15/17      PT SHORT TERM GOAL #2   Title  Patient will perform SLS for 10 seconds on Bil LE to improve balance with gait and stair ambulation and reduce fall risk.    Time  2    Period  Weeks    Status  New        PT Long Term Goals -  12/01/17 1543      PT LONG TERM GOAL #1   Title  Patient will improve MMT by 1 grade for limited muscle groups to demonstrate improved muscle activationa nd functional strength to improve gait and balance.    Time  4    Period  Weeks    Status  New    Target Date  12/29/17      PT LONG TERM GOAL #2   Title  Patient will score a = or >20 on the DGI to indicate significant improvement in balance and reduce her risk of falling while ambulating in the community.    Time  4    Period  Weeks    Status  New      PT LONG TERM GOAL #3   Title  Patient will ambulate at 1.0 m/s or faster during 2 MWT to demonstrate reduced risk of falling during and increased independence with ADL's and community mobility.    Time  4    Period  Weeks    Status  New           12/01/17 1450  Plan  Clinical Impression Statement Ms. Posa presents for physical therapy evaluation for balance and dizziness. She reports she has only had one episode of dizziness and denies oscillopsia, diplopia, blurred vision, nausea, vomiting, headaches, and memory loss. She reports she had an eye operation ~ 1 month ago on her Lt eye for glaucoma and is planning to have it performed on her Rt eye eventually. She has a follow up with her eye doctor this Thursday. She reports at her regular physical/wellness visit with Dr. Criss Rosales she was asked to walk with one foot in front of the other and was a little unsteady, Dr. Criss Rosales recommended she come to PT to address her balance and dizziness. 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 She has never used an assistive device to ambulate and has a shower chair but does not use it. Objective testing revealed decreased balance with SLS, bil LE weakness primarily around her hip, and her DGI score indicates she is at risk for falls while walking. She will benefit from skilled PT interventions to address her weaknesses and improve balance to  reduce fall risk and improve mobility.  Clinical Presentation Stable  Clinical Presentation due to: DIG, MMT, SLS, clinical judgement  Clinical Decision Making Low  Pt will benefit from skilled therapeutic intervention in order to improve on the following deficits Abnormal gait;Decreased activity tolerance;Decreased endurance;Decreased strength;Decreased balance;Decreased mobility;Difficulty walking  Rehab Potential Good  PT Frequency 2x / week  PT Duration 4 weeks  PT Treatment/Interventions ADLs/Self Care Home Management;Aquatic Therapy;DME Instruction;Gait training;Stair training;Functional mobility training;Therapeutic activities;Therapeutic exercise;Balance training;Neuromuscular re-education;Patient/family education;Vestibular  PT Next Visit Plan Review Eval and goals. Perform FOTO and 2MWT. Initiate hip strengthening in standing and review HEP. Initiate SLS and tandem balance challenges.  PT Home Exercise Plan Eval: bridge, SLR  Consulted and Agree with Plan of Care Patient       Patient will benefit from skilled therapeutic intervention in order to improve the following deficits and impairments:  Abnormal gait, Decreased activity tolerance, Decreased endurance, Decreased strength, Decreased balance, Decreased mobility, Difficulty walking  Visit Diagnosis: Other abnormalities of gait and mobility  Muscle weakness (generalized)     Problem List There are no active problems to display for this patient.   Kipp Brood, PT, DPT Physical Therapist with [facility]  12/01/2017 5:10 PM    [place], [place], [address] Phone: [phone]   Fax:  [phone]  Name: Abigail Frazier MRN: [id number] Date of Birth: Dec 23, 1940

## 2017-12-04 ENCOUNTER — Ambulatory Visit (HOSPITAL_COMMUNITY): Payer: Medicare Other

## 2017-12-04 ENCOUNTER — Telehealth (HOSPITAL_COMMUNITY): Payer: Self-pay | Admitting: Family Medicine

## 2017-12-04 NOTE — Telephone Encounter (Signed)
12/04/17  daughter cx appt and we discussed just adding this to the end of her schedule

## 2017-12-10 ENCOUNTER — Ambulatory Visit (HOSPITAL_COMMUNITY): Payer: Medicare Other

## 2017-12-10 ENCOUNTER — Encounter (HOSPITAL_COMMUNITY): Payer: Self-pay

## 2017-12-10 DIAGNOSIS — M6281 Muscle weakness (generalized): Secondary | ICD-10-CM | POA: Diagnosis not present

## 2017-12-10 DIAGNOSIS — R2689 Other abnormalities of gait and mobility: Secondary | ICD-10-CM

## 2017-12-10 NOTE — Therapy (Signed)
[place], [place], [address] Phone: [phone]   Fax:  [phone]  Physical Therapy Treatment  Patient Details  Name: Abigail Frazier MRN: [id number] Date of Birth: January 23, 1941 Referring Provider: Lucianne Lei, MD   Encounter Date: 12/10/2017  PT End of Session - 12/10/17 1408    Visit Number  2    Number of Visits  9    Date for PT Re-Evaluation  12/29/17    Authorization Type  [employer] Medicare (no auth required, visits based on medical necessity)    Authorization Time Period  12/01/17 - 12/29/17    Authorization - Visit Number  2    Authorization - Number of Visits  10    PT Start Time  1740    PT Stop Time  1430    PT Time Calculation (min)  38 min    Equipment Utilized During Treatment  Gait belt    Activity Tolerance  Patient tolerated treatment well    Behavior During Therapy  [facility] for tasks assessed/performed       History reviewed. No pertinent past medical history.  History reviewed. No pertinent surgical history.  There were no vitals filed for this visit.  Subjective Assessment - 12/10/17 1354    Subjective  Pt stated she is feeling okay today just a little tired, has been standing around with children volunteering.  No reoprts of pain, dizziness or recent falls.      Currently in Pain?  No/denies         [facility] PT Assessment - 12/10/17 0001      Assessment   Medical Diagnosis  Balance Disorder; Dizziness    Referring Provider  Lucianne Lei, MD    Onset Date/Surgical Date  -- unsure, patientt had 1 episode of dizziness, MD suggested PT    Prior Therapy  No      Observation/Other Assessments   Focus on Therapeutic Outcomes (FOTO)   64% (36% limited)                   [facility] Adult PT Treatment/Exercise - 12/10/17 0001      Knee/Hip Exercises: Standing   Hip Flexion  Limitations    Hip Flexion Limitations  marching 10x 3" alternating    Hip Abduction  Both;10  reps;Knee straight;Limitations    Abduction Limitations  cueing for form    SLS  Lt 6, Rt 8" max of 3    Other Standing Knee Exercises  partial tandem stance 2x 30"      Knee/Hip Exercises: Seated   Sit to Sand  10 reps;without UE support eccentric control      Knee/Hip Exercises: Supine   Bridges  Strengthening;1 set;Both;10 reps reviewed HEP    Straight Leg Raises  Strengthening;Both;1 set;10 reps reviewed HEP             PT Education - 12/10/17 1413    Education provided  Yes    Education Details  Reviewed goals, assured compliance with HEP and copy of eval given to pt.  Pt able to recall SLR but not bridges, able to demonstrate appropriate mechanics following initial verbal cueing.  FOTO complete as well.    Person(s) Educated  Patient    Methods  Explanation;Demonstration;Verbal cues;Handout    Comprehension  Verbalized understanding;Returned demonstration;Need further instruction       PT Short Term Goals - 12/01/17 1542      PT SHORT TERM GOAL #1   Title  Patient will be independent with HEP to improve functional strength to reduce risk of falls and improve mobility/gait.    Time  2    Period  Weeks    Status  New    Target Date  12/15/17      PT SHORT TERM GOAL #2   Title  Patient will perform SLS for 10 seconds on Bil LE to improve balance with gait and stair ambulation and reduce fall risk.    Time  2    Period  Weeks    Status  New        PT Long Term Goals - 12/01/17 1543      PT LONG TERM GOAL #1   Title  Patient will improve MMT by 1 grade for limited muscle groups to demonstrate improved muscle activationa nd functional strength to improve gait and balance.    Time  4    Period  Weeks    Status  New    Target Date  12/29/17      PT LONG TERM GOAL #2   Title  Patient will score a = or >20 on the DGI to indicate significant improvement in balance and reduce her risk of falling while ambulating in the community.    Time  4    Period  Weeks     Status  New      PT LONG TERM GOAL #3   Title  Patient will ambulate at 1.0 m/s or faster during 2 MWT to demonstrate reduced risk of falling during and increased independence with ADL's and community mobility.    Time  4    Period  Weeks    Status  New            Plan - 12/10/17 1856    Clinical Impression Statement  Reviewed goals, assured compliance with HEP and copy of eval given to pt.  Pt able to remember 1 out of 2 of the HEP exercises, required refreshment for form and technique to complete both correctly.  FOTO complete with 36% limitations.  Progressed to standing hip strengthening and balance activities.  Pt required HHA with all exercises for balance and proper form.  No reports of pain through session, was limited by fatigue with activities.      Rehab Potential  Good    PT Frequency  2x / week    PT Duration  4 weeks    PT Treatment/Interventions  ADLs/Self Care Home Management;Aquatic Therapy;DME Instruction;Gait training;Stair training;Functional mobility training;Therapeutic activities;Therapeutic exercise;Balance training;Neuromuscular re-education;Patient/family education;Vestibular    PT Next Visit Plan  Review compliance with HEP. Progress hip strengthening in standing and balance activities (SLS and tandem).    PT Home Exercise Plan  Eval: bridge, SLR       Patient will benefit from skilled therapeutic intervention in order to improve the following deficits and impairments:  Abnormal gait, Decreased activity tolerance, Decreased endurance, Decreased strength, Decreased balance, Decreased mobility, Difficulty walking  Visit Diagnosis: Other abnormalities of gait and mobility  Muscle weakness (generalized)     Problem List There are no active problems to display for this patient.  [address], LPTA; [place]  Aldona Lento 12/10/2017, 7:02 PM  [place] [address] [place], [place],  [address] Phone: [phone]   Fax:  [phone]  Name: Abigail Frazier MRN: [id number] Date of Birth: January 05, 1941

## 2017-12-11 ENCOUNTER — Ambulatory Visit (HOSPITAL_COMMUNITY): Payer: Medicare Other

## 2017-12-11 ENCOUNTER — Other Ambulatory Visit: Payer: Self-pay

## 2017-12-11 ENCOUNTER — Encounter (HOSPITAL_COMMUNITY): Payer: Self-pay

## 2017-12-11 DIAGNOSIS — M6281 Muscle weakness (generalized): Secondary | ICD-10-CM | POA: Diagnosis not present

## 2017-12-11 DIAGNOSIS — R2689 Other abnormalities of gait and mobility: Secondary | ICD-10-CM | POA: Diagnosis not present

## 2017-12-11 NOTE — Therapy (Signed)
[place], [place], [address] Phone: [phone]   Fax:  [phone]  Physical Therapy Treatment  Patient Details  Name: Abigail Frazier MRN: [id number] Date of Birth: 02-17-1941 Referring Provider: Lucianne Lei, MD   Encounter Date: 12/11/2017  PT End of Session - 12/11/17 1311    Visit Number  3    Number of Visits  9    Date for PT Re-Evaluation  12/29/17    Authorization Type  [employer] Medicare (no auth required, visits based on medical necessity)    Authorization Time Period  12/01/17 - 12/29/17    Authorization - Visit Number  3    Authorization - Number of Visits  10    PT Start Time  7824    PT Stop Time  1345    PT Time Calculation (min)  40 min    Equipment Utilized During Treatment  Gait belt    Activity Tolerance  Patient tolerated treatment well    Behavior During Therapy  [facility] for tasks assessed/performed       History reviewed. No pertinent past medical history.  History reviewed. No pertinent surgical history.  There were no vitals filed for this visit.  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 She reports she has trouble doing the beidges athome on her soft bed but has been donig them every day.    Pertinent History  eye surgery Lt for glaucoma ~ April 2019    Limitations  Walking;House hold activities    Currently in Pain?  No/denies        [facility] Adult PT Treatment/Exercise - 12/11/17 0001      Knee/Hip Exercises: Standing   Heel Raises  Both;1 set;20 reps;Limitations    Heel Raises Limitations  Bil UE support at counter    Lateral Step Up  Both;1 set;15 reps;Step Height: 4";Hand Hold: 2    Forward Step Up  Both;1 set;15 reps;Hand Hold: 0;Step Height: 4"      Knee/Hip Exercises: Seated   Sit to Sand  2 sets;10 reps;without UE support cues for slow eccentric control for lowering      Knee/Hip Exercises: Supine   Bridges  Strengthening;Both;15 reps;1 set    Straight Leg Raises  Strengthening;Both;1 set;15 reps      Knee/Hip Exercises: Sidelying   Clams  1x 15 rpes Bil LE, no band, tactile cues to deter posterior trunk rotation       Balance Exercises - 12/11/17 1317      Balance Exercises: Standing   Partial Tandem Stance  Eyes open;Foam/compliant surface;3 reps;20 secs 3 reps bil LE    Marching Limitations  15 reps Bil LE with 3 second holds, on foam, min Assist to weight shift over LE for SLS        PT Education - 12/11/17 1340    Education provided  Yes    Education Details  Educated on exercises throughout session providing tactile/verbal cues as needed.     Person(s) Educated  Patient    Methods  Explanation;Tactile cues;Verbal cues    Comprehension  Verbalized understanding;Returned demonstration       PT Short Term Goals - 12/01/17 1542      PT SHORT TERM GOAL #1   Title  Patient will be independent with HEP to improve functional strength to reduce risk of falls and improve mobility/gait.  Time  2    Period  Weeks    Status  New    Target Date  12/15/17      PT SHORT TERM GOAL #2   Title  Patient will perform SLS for 10 seconds on Bil LE to improve balance with gait and stair ambulation and reduce fall risk.    Time  2    Period  Weeks    Status  New        PT Long Term Goals - 12/01/17 1543      PT LONG TERM GOAL #1   Title  Patient will improve MMT by 1 grade for limited muscle groups to demonstrate improved muscle activationa nd functional strength to improve gait and balance.    Time  4    Period  Weeks    Status  New    Target Date  12/29/17      PT LONG TERM GOAL #2   Title  Patient will score a = or >20 on the DGI to indicate significant improvement in balance and reduce her risk of falling while ambulating in the community.    Time  4    Period  Weeks    Status  New      PT LONG TERM GOAL #3   Title  Patient will  ambulate at 1.0 m/s or faster during 2 MWT to demonstrate reduced risk of falling during and increased independence with ADL's and community mobility.    Time  4    Period  Weeks    Status  New        Plan - 12/11/17 1342    Clinical Impression Statement  Continued with hip strengthening and balance training today for patient. Progressed forward step ups without UE assist to challenge balance, initiated lateral step ups and patient used fingertip support bil UE for this exercises. Trialed tandem stance on foam but regressed to modified tandem as patient required external support to prevent LOB with full tandem, she had much improved stability with modified stance. She denied pain throughout session but required several rest breaks due to fatigue with activities. She will continue to benefit from skilled PT interventions to address impairments and improve balance.    Rehab Potential  Good    PT Frequency  2x / week    PT Duration  4 weeks    PT Treatment/Interventions  ADLs/Self Care Home Management;Aquatic Therapy;DME Instruction;Gait training;Stair training;Functional mobility training;Therapeutic activities;Therapeutic exercise;Balance training;Neuromuscular re-education;Patient/family education;Vestibular    PT Next Visit Plan  Review compliance with HEP. Continue with functional hip strengthening and with balance training. Progress to tandem gait on solid surface and side stepping.     PT Home Exercise Plan  Eval: bridge, SLR    Consulted and Agree with Plan of Care  Patient       Patient will benefit from skilled therapeutic intervention in order to improve the following deficits and impairments:  Abnormal gait, Decreased activity tolerance, Decreased endurance, Decreased strength, Decreased balance, Decreased mobility, Difficulty walking  Visit Diagnosis: Other abnormalities of gait and mobility  Muscle weakness (generalized)     Problem List There are no active problems to  display for this patient.   Kipp Brood, PT, DPT Physical Therapist with [facility]  12/11/2017 1:45 PM    [place], [place], [address] Phone: [phone]   Fax:  [phone]  Name: Abigail Frazier MRN: [id number] Date of  Birth: 01-30-1941

## 2017-12-12 ENCOUNTER — Encounter (HOSPITAL_COMMUNITY): Payer: Medicare Other

## 2017-12-15 ENCOUNTER — Ambulatory Visit (HOSPITAL_COMMUNITY): Payer: Medicare Other | Attending: Family Medicine

## 2017-12-15 ENCOUNTER — Other Ambulatory Visit: Payer: Self-pay

## 2017-12-15 ENCOUNTER — Encounter (HOSPITAL_COMMUNITY): Payer: Self-pay

## 2017-12-15 DIAGNOSIS — M6281 Muscle weakness (generalized): Secondary | ICD-10-CM | POA: Diagnosis not present

## 2017-12-15 DIAGNOSIS — R2689 Other abnormalities of gait and mobility: Secondary | ICD-10-CM | POA: Diagnosis not present

## 2017-12-15 NOTE — Patient Instructions (Signed)
Clamshell REPS: 10-20  SETS: 1-3  HOLD: 3 seconds  WEEKLY: 7x  DAILY: 1x Setup Begin lying on your side with your legs bent and feet together. Movement Lift your top knee upward while keeping your feet together, then lower it back down and repeat. Tip Make sure that your hips do not fall backward as you lift your leg. Focus your awareness on the movement of your leg. STEP 1 STEP 2 STEP 3 Standing Single Leg Stance with Counter Support REPS: 5  SETS: 1-3  HOLD: 10-20 seconds  WEEKLY: 7x  DAILY: 1x Clinician Notes: as you feel more confident, decrease the support of your hands Setup Begin in a standing upright position with your hands resting on a counter. Movement Lift one foot off the ground and maintain your balance in this position. Tip Make sure to maintain an upright posture and use the counter to help you balance as needed.

## 2017-12-15 NOTE — Therapy (Signed)
[place], [place], [address] Phone: [phone]   Fax:  [phone]  Physical Therapy Treatment  Patient Details  Name: Abigail Frazier MRN: [id number] Date of Birth: Dec 07, 1940 Referring Provider: Lucianne Lei, MD   Encounter Date: 12/15/2017  PT End of Session - 12/15/17 1352    Visit Number  4    Number of Visits  9    Date for PT Re-Evaluation  12/29/17    Authorization Type  [employer] Medicare (no auth required, visits based on medical necessity)    Authorization Time Period  12/01/17 - 12/29/17    Authorization - Visit Number  4    Authorization - Number of Visits  10    PT Start Time  2778    PT Stop Time  1427    PT Time Calculation (min)  40 min    Equipment Utilized During Treatment  Gait belt    Activity Tolerance  Patient tolerated treatment well    Behavior During Therapy  [facility] for tasks assessed/performed       History reviewed. No pertinent past medical history.  History reviewed. No pertinent surgical history.  There were no vitals filed for this visit.  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 she reports her exercises are not as challenging anymore and that she does pretty well with them.    Pertinent History  eye surgery Lt for glaucoma ~ April 2019    Limitations  Walking;House hold activities    Currently in Pain?  No/denies        [facility] Adult PT Treatment/Exercise - 12/15/17 0001      Knee/Hip Exercises: Standing   Heel Raises  Both;1 set;20 reps;Limitations    Heel Raises Limitations  20 reps, bil LE, toe raises    Lateral Step Up  Both;1 set;15 reps;Step Height: 4";Hand Hold: 1    Forward Step Up  Both;1 set;15 reps;Hand Hold: 0;Step Height: 4"      Knee/Hip Exercises: Supine   Bridges  Strengthening;Both;15 reps;1 set    Straight Leg Raises   Strengthening;Both;1 set;15 reps      Knee/Hip Exercises: Sidelying   Clams  1x 15 rpes Bil LE, no band, tactile cues to deter posterior trunk rotation       Balance Exercises - 12/15/17 1425      Balance Exercises: Standing   SLS  Eyes open;Solid surface;3 reps bil LE (6 total)    Tandem Gait  Forward;4 reps;Limitations soild surface, 15'    Sidestepping  Foam/compliant support;Limitations 2x RT 18' on foam    Marching Limitations  15 reps Bil LE with 3 second holds, on foam, min Assist to weight shift over LE for SLS        PT Education - 12/15/17 1416    Education provided  Yes    Education Details  Educated on exercises throughout session and updated HEP with clamshell.    Person(s) Educated  Patient    Methods  Explanation;Tactile cues;Verbal cues    Comprehension  Verbalized understanding;Returned demonstration       PT Short Term Goals - 12/01/17 1542      PT SHORT TERM GOAL #1   Title  Patient will be independent with HEP to improve functional strength to reduce risk of falls and improve mobility/gait.  Time  2    Period  Weeks    Status  New    Target Date  12/15/17      PT SHORT TERM GOAL #2   Title  Patient will perform SLS for 10 seconds on Bil LE to improve balance with gait and stair ambulation and reduce fall risk.    Time  2    Period  Weeks    Status  New        PT Long Term Goals - 12/01/17 1543      PT LONG TERM GOAL #1   Title  Patient will improve MMT by 1 grade for limited muscle groups to demonstrate improved muscle activationa nd functional strength to improve gait and balance.    Time  4    Period  Weeks    Status  New    Target Date  12/29/17      PT LONG TERM GOAL #2   Title  Patient will score a = or >20 on the DGI to indicate significant improvement in balance and reduce her risk of falling while ambulating in the community.    Time  4    Period  Weeks    Status  New      PT LONG TERM GOAL #3   Title  Patient will ambulate at  1.0 m/s or faster during 2 MWT to demonstrate reduced risk of falling during and increased independence with ADL's and community mobility.    Time  4    Period  Weeks    Status  New        Plan - 12/15/17 1353    Clinical Impression Statement  Therapy session focused on hip strengthening and balance training today for patient. Continued with forward/lateral step-ups without UE assist to challenge balance. Initiated tandem gait on solid surface and side stepping on foam to advance balance training. She continues to be challenged by dynamic surfaces and continued with modified tandem on foam, patient with improved balance control requiring less external support. She denied pain throughout session but required several rest breaks due to fatigue with activities. She will continue to benefit from skilled PT interventions to address impairments and improve balance.    Rehab Potential  Good    PT Frequency  2x / week    PT Duration  4 weeks    PT Treatment/Interventions  ADLs/Self Care Home Management;Aquatic Therapy;DME Instruction;Gait training;Stair training;Functional mobility training;Therapeutic activities;Therapeutic exercise;Balance training;Neuromuscular re-education;Patient/family education;Vestibular    PT Next Visit Plan  Continue with functional hip strengthening and with balance training. Progress to tandem gait on solid surface and side stepping. Progress SLS challenge activities.    PT Home Exercise Plan  Eval: bridge, SLR; 12/15/17 - SLS at counter, clamshell    Consulted and Agree with Plan of Care  Patient       Patient will benefit from skilled therapeutic intervention in order to improve the following deficits and impairments:  Abnormal gait, Decreased activity tolerance, Decreased endurance, Decreased strength, Decreased balance, Decreased mobility, Difficulty walking  Visit Diagnosis: Other abnormalities of gait and mobility  Muscle weakness (generalized)     Problem  List There are no active problems to display for this patient.   Kipp Brood, PT, DPT Physical Therapist with [facility]  12/15/2017 2:32 PM    [place], [place], [address] Phone: [phone]   Fax:  [phone]  Name: Clemmie Buelna MRN:  [id number] Date of Birth: [birth year]

## 2017-12-17 ENCOUNTER — Ambulatory Visit (HOSPITAL_COMMUNITY): Payer: Medicare Other

## 2017-12-17 ENCOUNTER — Other Ambulatory Visit: Payer: Self-pay

## 2017-12-17 ENCOUNTER — Encounter (HOSPITAL_COMMUNITY): Payer: Self-pay

## 2017-12-17 DIAGNOSIS — M6281 Muscle weakness (generalized): Secondary | ICD-10-CM

## 2017-12-17 DIAGNOSIS — R2689 Other abnormalities of gait and mobility: Secondary | ICD-10-CM

## 2017-12-17 NOTE — Therapy (Signed)
[place], [place], [address] Phone: [phone]   Fax:  [phone]  Physical Therapy Treatment  Patient Details  Name: Abigail Frazier MRN: [id number] Date of Birth: 1940/11/17 Referring Provider: Lucianne Lei, MD   Encounter Date: 12/17/2017  PT End of Session - 12/17/17 1431    Visit Number  5    Number of Visits  9    Date for PT Re-Evaluation  12/29/17    Authorization Type  [employer] Medicare (no auth required, visits based on medical necessity)    Authorization Time Period  12/01/17 - 12/29/17    Authorization - Visit Number  5    Authorization - Number of Visits  10    PT Start Time  6270    PT Stop Time  1427    PT Time Calculation (min)  38 min    Equipment Utilized During Treatment  Gait belt    Activity Tolerance  Patient tolerated treatment well    Behavior During Therapy  [facility] for tasks assessed/performed       History reviewed. No pertinent past medical history.  History reviewed. No pertinent surgical history.  There were no vitals filed for this visit.  Subjective Assessment - 12/17/17 1351    Subjective  Patient reports she is feeling tired today and could have done with some more sleep. She reports good performance of her HEP and states she has no pain today.     Pertinent History  eye surgery Lt for glaucoma ~ April 2019    Limitations  Walking;House hold activities    Currently in Pain?  No/denies        [facility] Adult PT Treatment/Exercise - 12/17/17 0001      Knee/Hip Exercises: Standing   Heel Raises  Both;1 set;20 reps;Limitations    Heel Raises Limitations  20 reps, bil LE, toe raises    Lateral Step Up  Both;1 set;Step Height: 4";20 reps;Hand Hold: 2 fingertip support    Forward Step Up  Both;1 set;Step Height: 4";20 reps;Hand Hold: 1        Balance Exercises - 12/17/17 1358      Balance Exercises: Standing   Rockerboard   Anterior/posterior;Lateral;EO;Intermittent UE support 2x 1 minute each direction    Tandem Gait  Forward;Retro;3 reps 3 reps each direction, 15'    Step Over Hurdles / Cones  Side stepping 2x 15' both directions, latearl step over 4x 6" hurdles    Marching Limitations  15 reps Bil LE with 3 second holds, on foam, min Assist to weight shift over LE for SLS        PT Education - 12/17/17 1430    Education provided  Yes    Education Details  Educated on exercises throughout session and on new balance exercises and strategies.     Person(s) Educated  Patient    Methods  Explanation;Verbal cues;Tactile cues    Comprehension  Verbalized understanding;Tactile cues required;Need further instruction       PT Short Term Goals - 12/01/17 1542      PT SHORT TERM GOAL #1   Title  Patient will be independent with HEP to improve functional strength to reduce risk of falls and improve mobility/gait.    Time  2    Period  Weeks    Status  New    Target Date  12/15/17      PT SHORT TERM GOAL #2   Title  Patient will perform SLS  for 10 seconds on Bil LE to improve balance with gait and stair ambulation and reduce fall risk.    Time  2    Period  Weeks    Status  New        PT Long Term Goals - 12/01/17 1543      PT LONG TERM GOAL #1   Title  Patient will improve MMT by 1 grade for limited muscle groups to demonstrate improved muscle activationa nd functional strength to improve gait and balance.    Time  4    Period  Weeks    Status  New    Target Date  12/29/17      PT LONG TERM GOAL #2   Title  Patient will score a = or >20 on the DGI to indicate significant improvement in balance and reduce her risk of falling while ambulating in the community.    Time  4    Period  Weeks    Status  New      PT LONG TERM GOAL #3   Title  Patient will ambulate at 1.0 m/s or faster during 2 MWT to demonstrate reduced risk of falling during and increased independence with ADL's and community mobility.     Time  4    Period  Weeks    Status  New        Plan - 12/17/17 1431    Clinical Impression Statement  Patient is progressing well and therapy continues to focus on hip strengthening and balance training. Continued with tandem gait on solid surface this session and progressed to retro gait as well. Side stepping advanced to step over hurdles with minimum assist and tactile cues to maintain balance and form during training. Patient denied pain throughout session and demonstrates improved activity tolerance and did not require frequent rest breaks this session. She will continue to benefit from skilled PT interventions to address impairments and improve balance.    Rehab Potential  Good    PT Frequency  2x / week    PT Duration  4 weeks    PT Treatment/Interventions  ADLs/Self Care Home Management;Aquatic Therapy;DME Instruction;Gait training;Stair training;Functional mobility training;Therapeutic activities;Therapeutic exercise;Balance training;Neuromuscular re-education;Patient/family education;Vestibular    PT Next Visit Plan  Continue with functional hip strengthening and with balance training. Progress to tandem/retro gait on solid and advance SLS challenges with hurdles, cones and continue rockerborad.    PT Home Exercise Plan  Eval: bridge, SLR; 12/15/17 - SLS at counter, clamshell    Consulted and Agree with Plan of Care  Patient       Patient will benefit from skilled therapeutic intervention in order to improve the following deficits and impairments:  Abnormal gait, Decreased activity tolerance, Decreased endurance, Decreased strength, Decreased balance, Decreased mobility, Difficulty walking  Visit Diagnosis: Other abnormalities of gait and mobility  Muscle weakness (generalized)     Problem List There are no active problems to display for this patient.   Kipp Brood, PT, DPT Physical Therapist with [facility]  12/17/2017 3:42 PM    [place], [place], [address] Phone: [phone]   Fax:  [phone]  Name: Abigail Frazier MRN: [id number] Date of Birth: 07/03/1941

## 2017-12-22 ENCOUNTER — Encounter (HOSPITAL_COMMUNITY): Payer: Self-pay

## 2017-12-22 ENCOUNTER — Other Ambulatory Visit: Payer: Self-pay

## 2017-12-22 ENCOUNTER — Ambulatory Visit (HOSPITAL_COMMUNITY): Payer: Medicare Other

## 2017-12-22 DIAGNOSIS — M6281 Muscle weakness (generalized): Secondary | ICD-10-CM

## 2017-12-22 DIAGNOSIS — R2689 Other abnormalities of gait and mobility: Secondary | ICD-10-CM

## 2017-12-22 NOTE — Therapy (Signed)
[facility] [place], [place], [address] Phone: [phone]   Fax:  [phone]  Physical Therapy Treatment  Patient Details  Name: Abigail Frazier MRN: [id number] Date of Birth: [birth year] Referring Provider: Lucianne Lei, MD   Encounter Date: 12/22/2017  PT End of Session - 12/22/17 1352    Visit Number  6    Number of Visits  9    Date for PT Re-Evaluation  12/29/17    Authorization Type  [employer] Medicare (no auth required, visits based on medical necessity)    Authorization Time Period  12/01/17 - 12/29/17    Authorization - Visit Number  6    Authorization - Number of Visits  10    PT Start Time  5400    PT Stop Time  1427    PT Time Calculation (min)  40 min    Equipment Utilized During Treatment  Gait belt    Activity Tolerance  Patient tolerated treatment well    Behavior During Therapy  [facility] for tasks assessed/performed       History reviewed. No pertinent past medical history.  History reviewed. No pertinent surgical history.  There were no vitals filed for this visit.  Subjective Assessment - 12/22/17 1350    Subjective  Patient reports she is feeling well today and denies pain. She reports 1 episode of dizziness at church yesterday and states she probably would have fallen if her granddaughter was not there.     Pertinent History  eye surgery Lt for glaucoma ~ April 2019    Limitations  Walking;House hold activities    Currently in Pain?  No/denies        [facility] Adult PT Treatment/Exercise - 12/22/17 0001      Knee/Hip Exercises: Standing   Heel Raises  Both;1 set;20 reps;Limitations    Heel Raises Limitations  20 reps, bil LE, toe raises       Balance Exercises - 12/22/17 1406      Balance Exercises: Standing   Tandem Stance  Eyes open;Foam/compliant surface;Intermittent upper extremity support;3 reps;20 secs 3 reps alt Bil LE, (6 reps total)    SLS with Vectors  Solid surface;Other  reps (comment);Limitations 10x 3 cone taps on Bil LE    Rockerboard  Anterior/posterior;Lateral;EO;Intermittent UE support 2x 1 minute each direction    Tandem Gait  Forward;Retro;4 reps soild surface, 15'    Step Over Hurdles / Cones  12x lateral stepping over 2 - 6" hurdles    Marching Limitations  2 minutes Bil LE with 3 second holds, on foam        PT Education - 12/22/17 1649    Education provided  Yes    Education Details  Educated on exercises throughout and on weight shifting strategies. Educated on safety strategies for HEP at home, such as performing exercises at counter for support with balance training.     Person(s) Educated  Patient    Methods  Explanation;Verbal cues    Comprehension  Verbalized understanding;Returned demonstration       PT Short Term Goals - 12/01/17 1542      PT SHORT TERM GOAL #1   Title  Patient will be independent with HEP to improve functional strength to reduce risk of falls and improve mobility/gait.    Time  2    Period  Weeks    Status  New    Target Date  12/15/17      PT SHORT TERM GOAL #  2   Title  Patient will perform SLS for 10 seconds on Bil LE to improve balance with gait and stair ambulation and reduce fall risk.    Time  2    Period  Weeks    Status  New        PT Long Term Goals - 12/01/17 1543      PT LONG TERM GOAL #1   Title  Patient will improve MMT by 1 grade for limited muscle groups to demonstrate improved muscle activationa nd functional strength to improve gait and balance.    Time  4    Period  Weeks    Status  New    Target Date  12/29/17      PT LONG TERM GOAL #2   Title  Patient will score a = or >20 on the DGI to indicate significant improvement in balance and reduce her risk of falling while ambulating in the community.    Time  4    Period  Weeks    Status  New      PT LONG TERM GOAL #3   Title  Patient will ambulate at 1.0 m/s or faster during 2 MWT to demonstrate reduced risk of falling during and  increased independence with ADL's and community mobility.    Time  4    Period  Weeks    Status  New        Plan - 12/22/17 1352    Clinical Impression Statement  Patient continues to progress in therapy with strengthening and balance training. Patient demonstrated improved postural strategies with tandem gait on solid surface this session as well as tandem stance on foam. She performed lateral stepping over 6" hurdles in // bars and required minimal cuing for sequencing. She continued with rockerboard and had improved weight shifting strategies with lateral balance this session. She also demonstrated improved SLS balance with cone taps and no UE support. Patient denied pain throughout session and continues to have improved activity tolerance and did not require frequent rest breaks this session. She will continue to benefit from skilled PT interventions to address impairments and improve balance.    Rehab Potential  Good    PT Frequency  2x / week    PT Duration  4 weeks    PT Treatment/Interventions  ADLs/Self Care Home Management;Aquatic Therapy;DME Instruction;Gait training;Stair training;Functional mobility training;Therapeutic activities;Therapeutic exercise;Balance training;Neuromuscular re-education;Patient/family education;Vestibular    PT Next Visit Plan  Continue balance activities with cones/hurdles for obstacle negotiation and SLS training. Continue with functional strengthening. Add sit to stands, and marching at counter as well as tandem at counter to HEP next session.    PT Home Exercise Plan  Eval: bridge, SLR; 12/15/17 - SLS at counter, clamshell    Consulted and Agree with Plan of Care  Patient       Patient will benefit from skilled therapeutic intervention in order to improve the following deficits and impairments:  Abnormal gait, Decreased activity tolerance, Decreased endurance, Decreased strength, Decreased balance, Decreased mobility, Difficulty walking  Visit  Diagnosis: Other abnormalities of gait and mobility  Muscle weakness (generalized)     Problem List There are no active problems to display for this patient.   Kipp Brood, PT, DPT Physical Therapist with [facility]  12/22/2017 4:50 PM    [place], [place], [address] Phone: [phone]   Fax:  [phone]  Name: Naelle Diegel MRN: [id number]  Date of Birth: October 30, 1940

## 2017-12-24 ENCOUNTER — Ambulatory Visit (HOSPITAL_COMMUNITY): Payer: Medicare Other

## 2017-12-24 ENCOUNTER — Encounter (HOSPITAL_COMMUNITY): Payer: Self-pay

## 2017-12-24 DIAGNOSIS — R2689 Other abnormalities of gait and mobility: Secondary | ICD-10-CM | POA: Diagnosis not present

## 2017-12-24 DIAGNOSIS — M6281 Muscle weakness (generalized): Secondary | ICD-10-CM | POA: Diagnosis not present

## 2017-12-24 NOTE — Therapy (Signed)
[place], [place], [address] Phone: [phone]   Fax:  [phone]  Physical Therapy Treatment  Patient Details  Name: Abigail Frazier MRN: [id number] Date of Birth: 1941-05-24 Referring Provider: Lucianne Lei, MD   Encounter Date: 12/24/2017  PT End of Session - 12/24/17 1349    Visit Number  7    Number of Visits  9    Date for PT Re-Evaluation  12/29/17    Authorization Type  [employer] Medicare (no auth required, visits based on medical necessity)    Authorization Time Period  12/01/17 - 12/29/17    Authorization - Visit Number  7    Authorization - Number of Visits  10    PT Start Time  2952    PT Stop Time  1428    PT Time Calculation (min)  41 min    Equipment Utilized During Treatment  Gait belt    Activity Tolerance  Patient tolerated treatment well    Behavior During Therapy  [facility] for tasks assessed/performed       History reviewed. No pertinent past medical history.  History reviewed. No pertinent surgical history.  There were no vitals filed for this visit.  Subjective Assessment - 12/24/17 1349    Subjective  Pt stated she is feeling good today, no reports of pain or recent LOB/dizzy episodes.      Pertinent History  eye surgery Lt for glaucoma ~ April 2019    Currently in Pain?  No/denies                            Balance Exercises - 12/24/17 1355      Balance Exercises: Standing   Tandem Stance  Eyes open;Foam/compliant surface;3 reps;30 secs 1st: static on floor/ HEP; 2nd on foam; 3rd on foam palvo    SLS  Eyes open;Solid surface;3 reps    SLS with Vectors  Solid surface;Intermittent upper extremity assist;3 reps 3x 5" holds each LE; cueing for form    Tandem Gait  2 reps    Sidestepping  Foam/compliant support;Limitations    Step Over Hurdles / Cones  2RT alternating 6" and 12"    Marching Limitations  2 sets 1st) static by counter with intermittent  HHA; 2nd:on foam    Heel Raises Limitations  20x    Toe Raise Limitations  20x    Sit to Stand Time  10x no HHA; eccentric control with RTB around knees to prevent valgus          PT Short Term Goals - 12/01/17 1542      PT SHORT TERM GOAL #1   Title  Patient will be independent with HEP to improve functional strength to reduce risk of falls and improve mobility/gait.    Time  2    Period  Weeks    Status  New    Target Date  12/15/17      PT SHORT TERM GOAL #2   Title  Patient will perform SLS for 10 seconds on Bil LE to improve balance with gait and stair ambulation and reduce fall risk.    Time  2    Period  Weeks    Status  New        PT Long Term Goals - 12/01/17 1543      PT LONG TERM GOAL #1   Title  Patient will improve MMT by 1 grade for  limited muscle groups to demonstrate improved muscle activationa nd functional strength to improve gait and balance.    Time  4    Period  Weeks    Status  New    Target Date  12/29/17      PT LONG TERM GOAL #2   Title  Patient will score a = or >20 on the DGI to indicate significant improvement in balance and reduce her risk of falling while ambulating in the community.    Time  4    Period  Weeks    Status  New      PT LONG TERM GOAL #3   Title  Patient will ambulate at 1.0 m/s or faster during 2 MWT to demonstrate reduced risk of falling during and increased independence with ADL's and community mobility.    Time  4    Period  Weeks    Status  New            Plan - 12/24/17 1534    Clinical Impression Statement  Session focus on balance training and functional strengthening.  Increased focus this session with SLS activities.  Increased forward step overs to 12in hurdles with 3-5" holds to improve SLS, pt with some difficulty covering higher hurdle, min A with activity for LOB episodes.  Reviewed compliance with HEP with additional exercises given for gluteal strengthening and NBOS/SLS activities, pt able to  demonstrate and verbalize understanding wiht new exercises and importance of completeing beside counter top.  No reports of pain through session, was limited by fatigue with activities.      Rehab Potential  Good    PT Frequency  2x / week    PT Duration  4 weeks    PT Treatment/Interventions  ADLs/Self Care Home Management;Aquatic Therapy;DME Instruction;Gait training;Stair training;Functional mobility training;Therapeutic activities;Therapeutic exercise;Balance training;Neuromuscular re-education;Patient/family education;Vestibular    PT Next Visit Plan  Continue balance activities with cones/hurdles for obstacle negotiation and SLS training. Continue with functional strengthening.    PT Home Exercise Plan  Eval: bridge, SLR; 12/15/17 - SLS at counter, clamshell; 6/12: STS, tandem stance and marching by counter       Patient will benefit from skilled therapeutic intervention in order to improve the following deficits and impairments:  Abnormal gait, Decreased activity tolerance, Decreased endurance, Decreased strength, Decreased balance, Decreased mobility, Difficulty walking  Visit Diagnosis: Other abnormalities of gait and mobility  Muscle weakness (generalized)     Problem List There are no active problems to display for this patient.  [address], LPTA; [place]  Aldona Lento 12/24/2017, 4:20 PM  [place] [address] [place], [place], [address] Phone: [phone]   Fax:  [phone]  Name: Abigail Frazier MRN: [id number] Date of Birth: 1941-01-24

## 2017-12-24 NOTE — Patient Instructions (Signed)
Functional Quadriceps: Sit to Stand    Sit on edge of chair, feet flat on floor. Stand upright, extending knees fully. Repeat 10 times per set. Do 1-2 sets per session.   http://orth.exer.us/735   Copyright  VHI. All rights reserved.   Marching in Place: Varied Surfaces    Standing next to counter at home march in place, slowly lifting knees toward ceiling. Repeat 10 times per session. Do 1-2 sessions per day.  Copyright  VHI. All rights reserved.   Tandem Stance    Right foot in front of left, heel touching toe both feet "straight ahead". Stand on Foot Triangle of Support with both feet. Balance in this position 30 seconds. Do with left foot in front of right.  Complete 3 sets per leg.  Copyright  VHI. All rights reserved.

## 2017-12-29 ENCOUNTER — Ambulatory Visit (HOSPITAL_COMMUNITY): Payer: Medicare Other

## 2017-12-29 ENCOUNTER — Other Ambulatory Visit: Payer: Self-pay

## 2017-12-29 ENCOUNTER — Encounter (HOSPITAL_COMMUNITY): Payer: Self-pay

## 2017-12-29 DIAGNOSIS — M6281 Muscle weakness (generalized): Secondary | ICD-10-CM | POA: Diagnosis not present

## 2017-12-29 DIAGNOSIS — R2689 Other abnormalities of gait and mobility: Secondary | ICD-10-CM | POA: Diagnosis not present

## 2017-12-29 NOTE — Therapy (Signed)
[place], [place], [address] Phone: [phone]   Fax:  [phone]  Physical Therapy Treatment/Discharge Summary  Patient Details  Name: Meenakshi Sazama MRN: [id number] Date of Birth: 1940-08-10 Referring Provider: Lucianne Lei, MD   Encounter Date: 12/29/2017   Progress Note Reporting Period 12/01/2017 to 12/29/2017  See note below for Objective Data and Assessment of Progress/Goals.   PHYSICAL THERAPY DISCHARGE SUMMARY  Visits from Start of Care: 8  Current functional level related to goals / functional outcomes: Re-assessment performed this session and Ms. Christon has met/partially met all but 1 goal. She has made significant improvements in bil LE strength and in DGI with a score of 20/24 indicating she is at reduce risk of falling. She also ambulated at 0.94 m/s in 2MWT, slightly below her goal of 1.0 m/s. She was unable to perform SLS bil for 10 seconds however did improve to maintain balance for 6-8 second on bil LE when she was unable to do so at at evaluation. She has demonstrated consistent performance of HEP and has been educated on benefits of joining a local walking group/fitness center to maintain her current balance and strength. She has reported feeling ready to continue independently and will be discharged following this session.   Remaining deficits: See below details   Education / Equipment: See below details  Plan: Patient agrees to discharge.  Patient goals were partially met. Patient is being discharged due to meeting the stated rehab goals.  ?????       PT End of Session - 12/29/17 1523    Visit Number  8    Number of Visits  9    Date for PT Re-Evaluation  12/29/17    Authorization Type  [employer] (no auth required, visits based on medical necessity)    Authorization Time Period  12/01/17 - 12/29/17    Authorization - Visit Number  8    Authorization - Number of  Visits  10    PT Start Time  1518    PT Stop Time  1552 discharged    PT Time Calculation (min)  34 min    Equipment Utilized During Treatment  Gait belt    Activity Tolerance  Patient tolerated treatment well    Behavior During Therapy  [facility] for tasks assessed/performed       History reviewed. No pertinent past medical history.  History reviewed. No pertinent surgical history.  There were no vitals filed for this visit.  Subjective Assessment - 12/29/17 1522    Subjective  Patient denies dizziness and denies falls. She is excited it is her last day.    Pertinent History  eye surgery Lt for glaucoma ~ April 2019    Limitations  Walking;House hold activities    Currently in Pain?  No/denies         [facility] PT Assessment - 12/29/17 0001      Assessment   Medical Diagnosis  Balance Disorder; Dizziness    Referring Provider  Lucianne Lei, MD    Onset Date/Surgical Date  -- unsure, patient had 1 episode of dizziness, MD suggested PT    Prior Therapy  No      Precautions   Precautions  None      Restrictions   Weight Bearing Restrictions  No      Observation/Other Assessments   Focus on Therapeutic Outcomes (FOTO)   20% limited was 36% limited      Sensation  Light Touch  Appears Intact      Functional Tests   Functional tests  Single leg stance      Single Leg Stance   Comments  Bil LE = 6-8 was <5      Strength   Right Hip Flexion  4+/5 was 4    Right Hip Extension  4-/5 was 3+    Right Hip ABduction  4+/5 was 4    Left Hip Flexion  5/5 was 4+    Left Hip Extension  4-/5 was 3+    Left Hip ABduction  4/5 was 4-    Right Knee Flexion  4+/5 was 4+    Right Knee Extension  5/5 was 5    Left Knee Flexion  4+/5 was 4    Left Knee Extension  5/5 was 5    Right Ankle Dorsiflexion  5/5 was 5    Left Ankle Dorsiflexion  5/5 was 5      Dynamic Gait Index   Level Surface  Normal    Change in Gait Speed  Normal    Gait with Horizontal Head Turns  Mild Impairment     Gait with Vertical Head Turns  Mild Impairment    Gait and Pivot Turn  Normal    Step Over Obstacle  Mild Impairment    Step Around Obstacles  Normal    Steps  Mild Impairment    Total Score  20    DGI comment:  20/24 = decreased risk for fall during ambulation ( was 17/24 on 12/01/17)        PT Education - 12/29/17 1553    Education provided  Yes    Education Details  Educated on importance of continuing wtih HEP to maintain progress with balance and strength. Educated on benefits of joining a local fitnes facilty or walking group to improve health and endurance.    Person(s) Educated  Patient    Methods  Explanation    Comprehension  Verbalized understanding       PT Short Term Goals - 12/29/17 1546      PT SHORT TERM GOAL #1   Title  Patient will be independent with HEP to improve functional strength to reduce risk of falls and improve mobility/gait.    Time  2    Period  Weeks    Status  Achieved      PT SHORT TERM GOAL #2   Title  Patient will perform SLS for 10 seconds on Bil LE to improve balance with gait and stair ambulation and reduce fall risk.    Time  2    Period  Weeks    Status  Not Met        PT Long Term Goals - 12/29/17 1546      PT LONG TERM GOAL #1   Title  Patient will improve MMT by 1 grade for limited muscle groups to demonstrate improved muscle activationa nd functional strength to improve gait and balance.    Time  4    Period  Weeks    Status  Partially Met      PT LONG TERM GOAL #2   Title  Patient will score a = or >20 on the DGI to indicate significant improvement in balance and reduce her risk of falling while ambulating in the community.    Time  4    Period  Weeks    Status  Achieved      PT LONG TERM  GOAL #3   Title  Patient will ambulate at 1.0 m/s or faster during 2 MWT to demonstrate reduced risk of falling during and increased independence with ADL's and community mobility.    Baseline  0.94 m/s    Time  4    Period  Weeks     Status  Partially Met        Plan - 12/29/17 1553    Clinical Impression Statement  Re-assessment performed this session and Ms. Adamik has met/partially met all but 1 goal. She has made significant improvements in bil LE strength and in DGI with a score of 20/24 indicating she is at reduce risk of falling. She also ambulated at 0.94 m/s in 2MWT, slightly below her goal of 1.0 m/s. She was unable to perform SLS bil for 10 seconds however did improve to maintain balance for 6-8 second on bil LE when she was unable to do so at at evaluation. She has demonstrated consistent performance of HEP and has been educated on benefits of joining a local walking group/fitness center to maintain her current balance and strength. She has reported feeling ready to continue independently and will be discharged following this session.    Rehab Potential  Good    PT Frequency  2x / week    PT Duration  4 weeks    PT Treatment/Interventions  ADLs/Self Care Home Management;Aquatic Therapy;DME Instruction;Gait training;Stair training;Functional mobility training;Therapeutic activities;Therapeutic exercise;Balance training;Neuromuscular re-education;Patient/family education;Vestibular    PT Next Visit Plan  Discahrge this session    PT Home Exercise Plan  Eval: bridge, SLR; 12/15/17 - SLS at counter, clamshell; 6/12: STS, tandem stance and marching by counter    Consulted and Agree with Plan of Care  Patient       Patient will benefit from skilled therapeutic intervention in order to improve the following deficits and impairments:  Abnormal gait, Decreased activity tolerance, Decreased endurance, Decreased strength, Decreased balance, Decreased mobility, Difficulty walking  Visit Diagnosis: Other abnormalities of gait and mobility  Muscle weakness (generalized)     Problem List There are no active problems to display for this patient.   Kipp Brood, PT, DPT Physical Therapist with [facility]  12/29/2017 5:27 PM    [place] [address] [place], [place], [address] Phone: [phone]   Fax:  [phone]  Name: Chava Dulac MRN: [id number] Date of Birth: 04-29-1941

## 2018-01-30 ENCOUNTER — Other Ambulatory Visit: Payer: Self-pay | Admitting: Pharmacist

## 2018-01-30 NOTE — Patient Outreach (Addendum)
[facility] Care Management  01/30/2018  Laurella Tull 1941/04/09 [id number]   Incoming call from [facility] in response to the [facility] Medication Adherence Campaign. Speak with patient. HIPAA identifiers verified and verbal consent received.  Ms. Koslow reports that she has run out of her lovastatin. Reports that when she received the [facility] call this week, she and her daughter reviewed her medications and realized that she was out of this one. Reports that she is going to get it from her [place]. Counsel patient on the importance of medication adherence and discuss the use of a weekly pillbox as an adherence tool. Ms. Facemire reports that her daughter helps her with getting her medications and that she will discuss the idea of using a pillbox with her. Ms. Fetsch reports that she needs to get going as she is on her way out of the house.   Offer to call patient's [place] and call her right back before she goes to confirm that the lovastatin prescription is ready. Call patient's [place] and the pharmacist states that she will have the prescription ready for the patient. Call Ms. Baldini back. She expresses appreciation for my help. Ms. Nouri reports that she has an appointment with her PCP next week. Encourage patient to bring her medications with her to this appointment to review these with her PCP.  Patient denies any further medication question/concerns at this time. Provide patient with my phone number.  Will close pharmacy episode at this time.  Harlow Asa, PharmD, [facility] [phone]

## 2018-02-23 DIAGNOSIS — E785 Hyperlipidemia, unspecified: Secondary | ICD-10-CM | POA: Diagnosis not present

## 2018-02-23 DIAGNOSIS — I10 Essential (primary) hypertension: Secondary | ICD-10-CM | POA: Diagnosis not present

## 2018-02-23 DIAGNOSIS — E782 Mixed hyperlipidemia: Secondary | ICD-10-CM | POA: Diagnosis not present

## 2018-03-01 ENCOUNTER — Emergency Department (HOSPITAL_COMMUNITY): Payer: Medicare Other

## 2018-03-01 ENCOUNTER — Other Ambulatory Visit: Payer: Self-pay

## 2018-03-01 ENCOUNTER — Emergency Department (HOSPITAL_COMMUNITY)
Admission: EM | Admit: 2018-03-01 | Discharge: 2018-03-01 | Disposition: A | Discharge status: Home / Self Care | Payer: Medicare Other | Attending: Emergency Medicine | Admitting: Emergency Medicine

## 2018-03-01 ENCOUNTER — Encounter (HOSPITAL_COMMUNITY): Payer: Self-pay | Admitting: Emergency Medicine

## 2018-03-01 DIAGNOSIS — J209 Acute bronchitis, unspecified: Secondary | ICD-10-CM | POA: Diagnosis not present

## 2018-03-01 DIAGNOSIS — R05 Cough: Secondary | ICD-10-CM | POA: Diagnosis not present

## 2018-03-01 HISTORY — DX: Low back pain: M54.5

## 2018-03-01 HISTORY — DX: Sciatica, unspecified side: M54.30

## 2018-03-01 HISTORY — DX: Other shoulder lesions, left shoulder: M75.82

## 2018-03-01 HISTORY — DX: Low back pain, unspecified: M54.50

## 2018-03-01 HISTORY — DX: Palpitations: R00.2

## 2018-03-01 HISTORY — DX: Other abnormalities of gait and mobility: R26.89

## 2018-03-01 HISTORY — DX: Essential (primary) hypertension: I10

## 2018-03-01 HISTORY — DX: Pure hypercholesterolemia, unspecified: E78.00

## 2018-03-01 LAB — BASIC METABOLIC PANEL
ANION GAP: 8 (ref 5–15)
BUN: 14 mg/dL (ref 8–23)
CO2: 26 mmol/L (ref 22–32)
Calcium: 8.5 mg/dL — ABNORMAL LOW (ref 8.9–10.3)
Chloride: 107 mmol/L (ref 98–111)
Creatinine, Ser: 0.79 mg/dL (ref 0.44–1.00)
GFR calc Af Amer: >60 mL/min (ref >60)
GFR calc non Af Amer: >60 mL/min (ref >60)
GLUCOSE: 97 mg/dL (ref 70–99)
POTASSIUM: 3.4 mmol/L — AB (ref 3.5–5.1)
Sodium: 141 mmol/L (ref 135–145)

## 2018-03-01 LAB — CBC WITH DIFFERENTIAL/PLATELET
Basophils Absolute: 0.1 10*3/uL (ref 0.0–0.1)
Basophils Relative: 1 %
Eosinophils Absolute: 0.4 10*3/uL (ref 0.0–0.7)
Eosinophils Relative: 4 %
HEMATOCRIT: 36.7 % (ref 36.0–46.0)
Hemoglobin: 11.9 g/dL — ABNORMAL LOW (ref 12.0–15.0)
LYMPHS PCT: 9 %
Lymphs Abs: 1 10*3/uL (ref 0.7–4.0)
MCH: 27.9 pg (ref 26.0–34.0)
MCHC: 32.4 g/dL (ref 30.0–36.0)
MCV: 85.9 fL (ref 78.0–100.0)
MONO ABS: 0.7 10*3/uL (ref 0.1–1.0)
MONOS PCT: 6 %
NEUTROS ABS: 9 10*3/uL — AB (ref 1.7–7.7)
Neutrophils Relative %: 80 %
Platelets: 209 10*3/uL (ref 150–400)
RBC: 4.27 MIL/uL (ref 3.87–5.11)
RDW: 15.5 % (ref 11.5–15.5)
WBC: 11.2 10*3/uL — ABNORMAL HIGH (ref 4.0–10.5)

## 2018-03-01 MED ORDER — HYDROCODONE-HOMATROPINE 5-1.5 MG/5ML PO SYRP: 5.0000 mL | ORAL_SOLUTION | Freq: Four times a day (QID) | ORAL | 0 refills | Status: DC | PRN

## 2018-03-01 MED ORDER — DEXAMETHASONE 4 MG PO TABS: 4.0000 mg | ORAL_TABLET | Freq: Two times a day (BID) | ORAL | 0 refills | Status: DC

## 2018-03-01 MED ORDER — HYDROCOD POLST-CPM POLST ER 10-8 MG/5ML PO SUER
5.0000 mL | Freq: Once | ORAL | Status: AC
  Administered 2018-03-01: 5 mL via ORAL
  Filled 2018-03-01: qty 5

## 2018-03-01 MED ORDER — ALBUTEROL SULFATE HFA 108 (90 BASE) MCG/ACT IN AERS
2.0000 | INHALATION_SPRAY | Freq: Once | RESPIRATORY_TRACT | Status: AC
  Administered 2018-03-01: 2 via RESPIRATORY_TRACT
  Filled 2018-03-01: qty 6.7

## 2018-03-01 NOTE — ED Triage Notes (Addendum)
Pt c/o dry, hacking cough and runny nose x 2 days. Denies fever, body aches. Pt reports "a little bit" of SOB. Pt has used hot tea and lemon drops with little relief.

## 2018-03-01 NOTE — ED Provider Notes (Signed)
[facility] EMERGENCY DEPARTMENT Provider Note   CSN: [id number] Arrival date & time: 03/01/18  0800     History   Chief Complaint No chief complaint on file.   HPI Abigail Frazier is a 77 y.o. female.  The history is provided by the patient and a relative.  Cough  This is a new problem. The current episode started 2 days ago. The problem occurs hourly. The problem has been gradually worsening. The cough is non-productive. There has been no fever. Associated symptoms include chills and rhinorrhea. Pertinent negatives include no chest pain, no shortness of breath and no wheezing. She has tried nothing for the symptoms. She is not a smoker. Her past medical history is significant for bronchitis.    Past Medical History:  Diagnosis Date  . Balance problem   . Low back pain   . Palpitations   . Rotator cuff tendinitis, left   . Sciatica     There are no active problems to display for this patient.   Past Surgical History:  Procedure Laterality Date  . GLAUCOMA SURGERY Left 10/2017     OB History   None      Home Medications    Prior to Admission medications   Medication Sig Start Date End Date Taking? Authorizing Provider  lovastatin (MEVACOR) 20 MG tablet Take 20 mg by mouth at bedtime.    [provider]    Family History No family history on file.  Social History Social History   Tobacco Use  . Smoking status: Not on file  Substance Use Topics  . Alcohol use: Not on file  . Drug use: Not on file     Allergies   Patient has no allergy information on record.   Review of Systems Review of Systems  Constitutional: Positive for chills. Negative for activity change.       All ROS Neg except as noted in HPI  HENT: Positive for congestion, postnasal drip and rhinorrhea. Negative for nosebleeds.   Eyes: Negative for photophobia and discharge.  Respiratory: Positive for cough. Negative for shortness of breath and wheezing.   Cardiovascular:  Negative for chest pain and palpitations.  Gastrointestinal: Negative for abdominal pain and blood in stool.  Genitourinary: Negative for dysuria, frequency and hematuria.  Musculoskeletal: Negative for arthralgias, back pain and neck pain.  Skin: Negative.   Neurological: Negative for dizziness, seizures and speech difficulty.  Psychiatric/Behavioral: Negative for confusion and hallucinations.     Physical Exam Updated Vital Signs There were no vitals taken for this visit.  Physical Exam  Constitutional: She is oriented to person, place, and time. She appears well-developed and well-nourished.  Non-toxic appearance.  HENT:  Head: Normocephalic.  Right Ear: Tympanic membrane and external ear normal.  Left Ear: Tympanic membrane and external ear normal.  Eyes: Pupils are equal, round, and reactive to light. EOM and lids are normal.  Neck: Normal range of motion. Neck supple. Carotid bruit is not present.  Cardiovascular: Normal rate, regular rhythm, normal heart sounds, intact distal pulses and normal pulses.  Pulmonary/Chest: Breath sounds normal. No respiratory distress.  Course breath sounds noted. Symmetrical rise and fall of the chest. Pt speaks in complete sentences.  Abdominal: Soft. Bowel sounds are normal. There is no tenderness. There is no guarding.  Musculoskeletal: Normal range of motion.  Lymphadenopathy:       Head (right side): No submandibular adenopathy present.       Head (left side): No submandibular adenopathy present.  She has no cervical adenopathy.  Neurological: She is alert and oriented to person, place, and time. She has normal strength. No cranial nerve deficit or sensory deficit.  Skin: Skin is warm and dry.  Psychiatric: She has a normal mood and affect. Her speech is normal.  Nursing note and vitals reviewed.    ED Treatments / Results  Labs (all labs ordered are listed, but only abnormal results are displayed) Labs Reviewed - No data to  display  EKG None  Radiology No results found.  Procedures Procedures (including critical care time)  Medications Ordered in ED Medications - No data to display   Initial Impression / Assessment and Plan / ED Course  I have reviewed the triage vital signs and the nursing notes.  Pertinent labs & imaging results that were available during my care of the patient were reviewed by me and considered in my medical decision making (see chart for details).       Final Clinical Impressions(s) / ED Diagnoses MDM  Pulse oximetry is 96% on room air.  Patient noted to have some tachypnea.  Patient treated in the emergency department with albuterol and cough medication.  Recheck.  Patient states she is feeling much better.  Basic metabolic panel is nonacute.  The complete blood count shows the white blood cells to be slightly elevated at 11,200, there is no shift to the left.  Chest x-ray shows no active cardiopulmonary disease. The examination and lab work-up endorse bronchitis.  The patient will be treated with Decadron and Hycodan.  Patient given an inhaler to use.  The patient will follow-up with Dr. Berdine Addison in the office.  Patient will return to the emergency department if any changes in condition, problems, or concerns.   Final diagnoses:  Acute bronchitis, unspecified organism    ED Discharge Orders         Ordered    HYDROcodone-homatropine (HYCODAN) 5-1.5 MG/5ML syrup  Every 6 hours PRN     03/01/18 1156    dexamethasone (DECADRON) 4 MG tablet  2 times daily with meals     03/01/18 1156    dexamethasone (DECADRON) 4 MG tablet  2 times daily with meals     03/01/18 1204    HYDROcodone-homatropine (HYCODAN) 5-1.5 MG/5ML syrup  Every 6 hours PRN     03/01/18 1204           Lily Kocher, PA-C 03/02/18 Brewer, [place], DO 03/04/18 1624

## 2018-03-01 NOTE — Discharge Instructions (Addendum)
Please use tylenol every 4 hours for fever or chills.  Please increase fluids. Wash hands frequently. Use 2 puffs of albuterol every 4 hours. Use decadron 2 times daily with food. Use hycodan every 6 hours for cough. This medication may cause drowsiness. Please do not drink, drive, or participate in activity that requires concentration while taking this medication.  Please see Dr Berdine Addison or return to the ED if any changes or problem in your condition.

## 2018-03-24 DIAGNOSIS — Z1231 Encounter for screening mammogram for malignant neoplasm of breast: Secondary | ICD-10-CM | POA: Diagnosis not present

## 2018-04-08 ENCOUNTER — Emergency Department (HOSPITAL_COMMUNITY)
Admission: EM | Admit: 2018-04-08 | Discharge: 2018-04-08 | Disposition: A | Discharge status: Home / Self Care | Payer: Medicare Other | Attending: Emergency Medicine | Admitting: Emergency Medicine

## 2018-04-08 ENCOUNTER — Encounter (HOSPITAL_COMMUNITY): Payer: Self-pay | Admitting: *Deleted

## 2018-04-08 DIAGNOSIS — I10 Essential (primary) hypertension: Secondary | ICD-10-CM | POA: Insufficient documentation

## 2018-04-08 DIAGNOSIS — Z79899 Other long term (current) drug therapy: Secondary | ICD-10-CM | POA: Diagnosis not present

## 2018-04-08 DIAGNOSIS — L729 Follicular cyst of the skin and subcutaneous tissue, unspecified: Secondary | ICD-10-CM | POA: Diagnosis not present

## 2018-04-08 NOTE — Discharge Instructions (Addendum)
Your examination is consistent with a cyst.  Your vital signs are within normal limits, there is no signs of infection at this time.  Please see your primary physician or return to the emergency department if any red streaks, or if the area turns red and hot to touch, or signs of infection.

## 2018-04-08 NOTE — ED Triage Notes (Signed)
Pt with raised area to left forearm that appeared midday per pt.  Pt denies any injury to forearm, discoloration noted in triage. Denies pain.

## 2018-04-08 NOTE — ED Provider Notes (Signed)
[facility] EMERGENCY DEPARTMENT Provider Note   CSN: [id number] Arrival date & time: 04/08/18  2004     History   Chief Complaint Chief Complaint  Patient presents with  . possible abscess    HPI Abigail Frazier is a 77 y.o. female.  Patient is a 77 year old female who presents to the emergency department with a raised area on the left forearm.  The patient states that she was at a funeral earlier today and she noticed a raised area on her arm.  She says she does not recall anything biting her.  She did not injure it.  Nothing scratched her.  She does not notice any itching or pain at the site.  She just noticed a raised area, and came to the emergency department for evaluation.  The history is provided by the patient.    Past Medical History:  Diagnosis Date  . Balance problem   . High cholesterol   . Hypertension   . Low back pain   . Palpitations   . Rotator cuff tendinitis, left   . Sciatica     There are no active problems to display for this patient.   Past Surgical History:  Procedure Laterality Date  . ABDOMINAL HYSTERECTOMY    . GLAUCOMA SURGERY Left 10/2017     OB History   None      Home Medications    Prior to Admission medications   Medication Sig Start Date End Date Taking? Authorizing Provider  amLODipine (NORVASC) 5 MG tablet Take 1 tablet by mouth daily. 12/24/17   [provider]  dexamethasone (DECADRON) 4 MG tablet Take 1 tablet (4 mg total) by mouth 2 (two) times daily with a meal. 03/01/18   Lily Kocher, PA-C  dexamethasone (DECADRON) 4 MG tablet Take 1 tablet (4 mg total) by mouth 2 (two) times daily with a meal. 03/01/18   Lily Kocher, PA-C  esomeprazole (NEXIUM) 40 MG capsule Take 1 capsule by mouth daily. 02/24/18   [provider]  hydrochlorothiazide (MICROZIDE) 12.5 MG capsule Take 1 capsule by mouth every other day. 02/23/18   [provider]  HYDROcodone-homatropine (HYCODAN) 5-1.5 MG/5ML syrup  Take 5 mLs by mouth every 6 (six) hours as needed. 03/01/18   Lily Kocher, PA-C  HYDROcodone-homatropine [facility]) 5-1.5 MG/5ML syrup Take 5 mLs by mouth every 6 (six) hours as needed. 03/01/18   Lily Kocher, PA-C  lovastatin (MEVACOR) 20 MG tablet Take 20 mg by mouth at bedtime.    [provider]    Family History History reviewed. No pertinent family history.  Social History Social History   Tobacco Use  . Smoking status: Never Smoker  . Smokeless tobacco: Never Used  Substance Use Topics  . Alcohol use: Never    Frequency: Never  . Drug use: Never     Allergies   Patient has no known allergies.   Review of Systems Review of Systems  Constitutional: Negative for activity change.       All ROS Neg except as noted in HPI  HENT: Negative for nosebleeds.   Eyes: Negative for photophobia and discharge.  Respiratory: Negative for cough, shortness of breath and wheezing.   Cardiovascular: Negative for chest pain and palpitations.  Gastrointestinal: Negative for abdominal pain and blood in stool.  Genitourinary: Negative for dysuria, frequency and hematuria.  Musculoskeletal: Positive for arthralgias and back pain. Negative for neck pain.  Skin: Negative.   Neurological: Negative for dizziness, seizures and speech difficulty.  Psychiatric/Behavioral: Negative  for confusion and hallucinations.     Physical Exam Updated Vital Signs BP 129/70 (BP Location: Right Arm)   Pulse 72   Temp 98.2 F (36.8 C) (Oral)   Resp 16   Ht 5\' 6"  (1.676 m)   Wt 74.8 kg   SpO2 98%   BMI 26.63 kg/m   Physical Exam  Constitutional: She is oriented to person, place, and time. She appears well-developed and well-nourished.  Non-toxic appearance.  HENT:  Head: Normocephalic.  Right Ear: Tympanic membrane and external ear normal.  Left Ear: Tympanic membrane and external ear normal.  Eyes: Pupils are equal, round, and reactive to light. EOM and lids are normal.  Neck: Normal  range of motion. Neck supple. Carotid bruit is not present.  Cardiovascular: Normal rate, regular rhythm, normal heart sounds, intact distal pulses and normal pulses.  Pulmonary/Chest: Breath sounds normal. No respiratory distress.  Abdominal: Soft. Bowel sounds are normal. There is no tenderness. There is no guarding.  Musculoskeletal: Normal range of motion.       Arms: There is fair range of the left shoulder with some crepitus present.  There is full range of the left elbow.  There is no palpable nodes in the bicep tricep area.  There is a small movable cyst of the forearm between the ulnar and radial surface dorsally.  There are no red streaks appreciated.  The area is not hot to touch.  It is not painful.  Lymphadenopathy:       Head (right side): No submandibular adenopathy present.       Head (left side): No submandibular adenopathy present.    She has no cervical adenopathy.  Neurological: She is alert and oriented to person, place, and time. She has normal strength. No cranial nerve deficit or sensory deficit.  Skin: Skin is warm and dry.  Psychiatric: She has a normal mood and affect. Her speech is normal.  Nursing note and vitals reviewed.    ED Treatments / Results  Labs (all labs ordered are listed, but only abnormal results are displayed) Labs Reviewed - No data to display  EKG None  Radiology No results found.  Procedures Procedures (including critical care time)  Medications Ordered in ED Medications - No data to display   Initial Impression / Assessment and Plan / ED Course  I have reviewed the triage vital signs and the nursing notes.  Pertinent labs & imaging results that were available during my care of the patient were reviewed by me and considered in my medical decision making (see chart for details).      Final Clinical Impressions(s) / ED Diagnoses MDM Vital signs are within normal limits.  Pulse oximetry is 98% on room air.  Within normal limits  by my interpretation.  The examination favors a cyst of the skin.  There is no evidence of infection.  There are no palpable nodes in the bicep tricep area, and as noted above the vital signs are within normal limits.  I have asked the patient to observe the area and if it changes, show sign of infection, or starts to cause pain, the patient is to notify Dr. Berdine Addison or return to the emergency department for additional evaluation.  Patient is in agreement with this plan.   Final diagnoses:  Cyst of skin    ED Discharge Orders    None       Lily Kocher, PA-C 04/08/18 2141    Francine Graven, DO 04/10/18 2157

## 2018-06-18 DIAGNOSIS — H401133 Primary open-angle glaucoma, bilateral, severe stage: Secondary | ICD-10-CM | POA: Diagnosis not present

## 2018-06-18 DIAGNOSIS — H35351 Cystoid macular degeneration, right eye: Secondary | ICD-10-CM | POA: Diagnosis not present

## 2018-06-25 DIAGNOSIS — H35351 Cystoid macular degeneration, right eye: Secondary | ICD-10-CM | POA: Diagnosis not present

## 2018-07-30 DIAGNOSIS — H26491 Other secondary cataract, right eye: Secondary | ICD-10-CM | POA: Diagnosis not present

## 2018-10-29 ENCOUNTER — Other Ambulatory Visit: Payer: Self-pay

## 2018-10-29 NOTE — Patient Outreach (Signed)
[facility]) Care Management  10/29/2018  Abigail Frazier 25-Jun-1941 [id number]   Medication Adherence call to Abigail Frazier [facility] [employer] Verify spoke with patient she is due on Lovastatin 20 mg patient is still taking 1 tablet daily she ask if we can call [facility] an order this medication patient wants a 90 days supply.[facility] said she did not have any refill and had call doctor's office for refills but doctor denied the refill. Call doctors office they said patient has not been seen since 02/2018 and they need to do blood work and patient need to make an appointment. Abigail Frazier is showing past due under [place].   [place] Management Direct Dial [phone]  Fax [phone] [email or web address]

## 2018-11-03 DIAGNOSIS — I10 Essential (primary) hypertension: Secondary | ICD-10-CM | POA: Diagnosis not present

## 2018-11-03 DIAGNOSIS — E785 Hyperlipidemia, unspecified: Secondary | ICD-10-CM | POA: Diagnosis not present

## 2018-11-03 DIAGNOSIS — Z7189 Other specified counseling: Secondary | ICD-10-CM | POA: Diagnosis not present

## 2018-11-03 DIAGNOSIS — Z0000 Encounter for general adult medical examination without abnormal findings: Secondary | ICD-10-CM | POA: Diagnosis not present

## 2019-04-05 DIAGNOSIS — M179 Osteoarthritis of knee, unspecified: Secondary | ICD-10-CM | POA: Diagnosis not present

## 2019-04-05 DIAGNOSIS — I10 Essential (primary) hypertension: Secondary | ICD-10-CM | POA: Diagnosis not present

## 2019-04-05 DIAGNOSIS — E785 Hyperlipidemia, unspecified: Secondary | ICD-10-CM | POA: Diagnosis not present

## 2019-06-14 ENCOUNTER — Other Ambulatory Visit: Payer: Self-pay

## 2019-06-14 NOTE — Patient Outreach (Signed)
[facility]) Care Management  06/14/2019  Catessa Knox Feb 24, 1941 [id number]   Medication Adherence call to Mrs. Valarie Blackstok [employer] Ident[employer] Verify spoke with patient she is past due on Lovastatin 20 mg,patient explain she takes 1 tablet daily she explain she has six more tables,patient ask to call [facility] to order this medication and Nexium,[facility] will have it ready for patient to pick up. Mrs. Blackstok is showing past due under [place].   [place] Management Direct Dial [phone]  Fax [phone] [email or web address]

## 2019-08-09 DIAGNOSIS — R600 Localized edema: Secondary | ICD-10-CM | POA: Diagnosis not present

## 2019-08-09 DIAGNOSIS — I10 Essential (primary) hypertension: Secondary | ICD-10-CM | POA: Diagnosis not present

## 2019-08-09 DIAGNOSIS — M1711 Unilateral primary osteoarthritis, right knee: Secondary | ICD-10-CM | POA: Diagnosis not present

## 2019-08-15 DIAGNOSIS — I10 Essential (primary) hypertension: Secondary | ICD-10-CM | POA: Diagnosis not present

## 2019-08-15 DIAGNOSIS — E785 Hyperlipidemia, unspecified: Secondary | ICD-10-CM | POA: Diagnosis not present

## 2019-08-22 ENCOUNTER — Other Ambulatory Visit: Payer: Self-pay

## 2019-08-22 ENCOUNTER — Ambulatory Visit: Payer: Medicare Other | Attending: Internal Medicine

## 2019-08-22 DIAGNOSIS — Z23 Encounter for immunization: Secondary | ICD-10-CM | POA: Insufficient documentation

## 2019-08-22 NOTE — Progress Notes (Signed)
   Covid-19 Vaccination Clinic  Name:  Abigail Frazier    MRN: [id number] DOB: [birth year]  08/22/2019  [name] was observed post Covid-19 immunization for 15 minutes without incidence. She was provided with Vaccine Information Sheet and instruction to access the V-Safe system.   [name] was instructed to call 911 with any severe reactions post vaccine: Marland Kitchen Difficulty breathing  . Swelling of your face and throat  . A fast heartbeat  . A bad rash all over your body  . Dizziness and weakness    Immunizations Administered    Name Date Dose VIS Date Route   Moderna COVID-19 Vaccine 08/22/2019  3:12 PM 0.5 mL 06/15/2019 Intramuscular   Manufacturer: Moderna   Lot: [id number]   [id number]

## 2019-09-11 IMAGING — DX DG CHEST 2V
2 series · 2 of 2 positions shown · non-contrast
Comparison: 11/25/2017

CLINICAL DATA: Cough and runny nose 2 days. Some
shortness-of-breath.

EXAM:
CHEST - 2 VIEW

[chest pa]
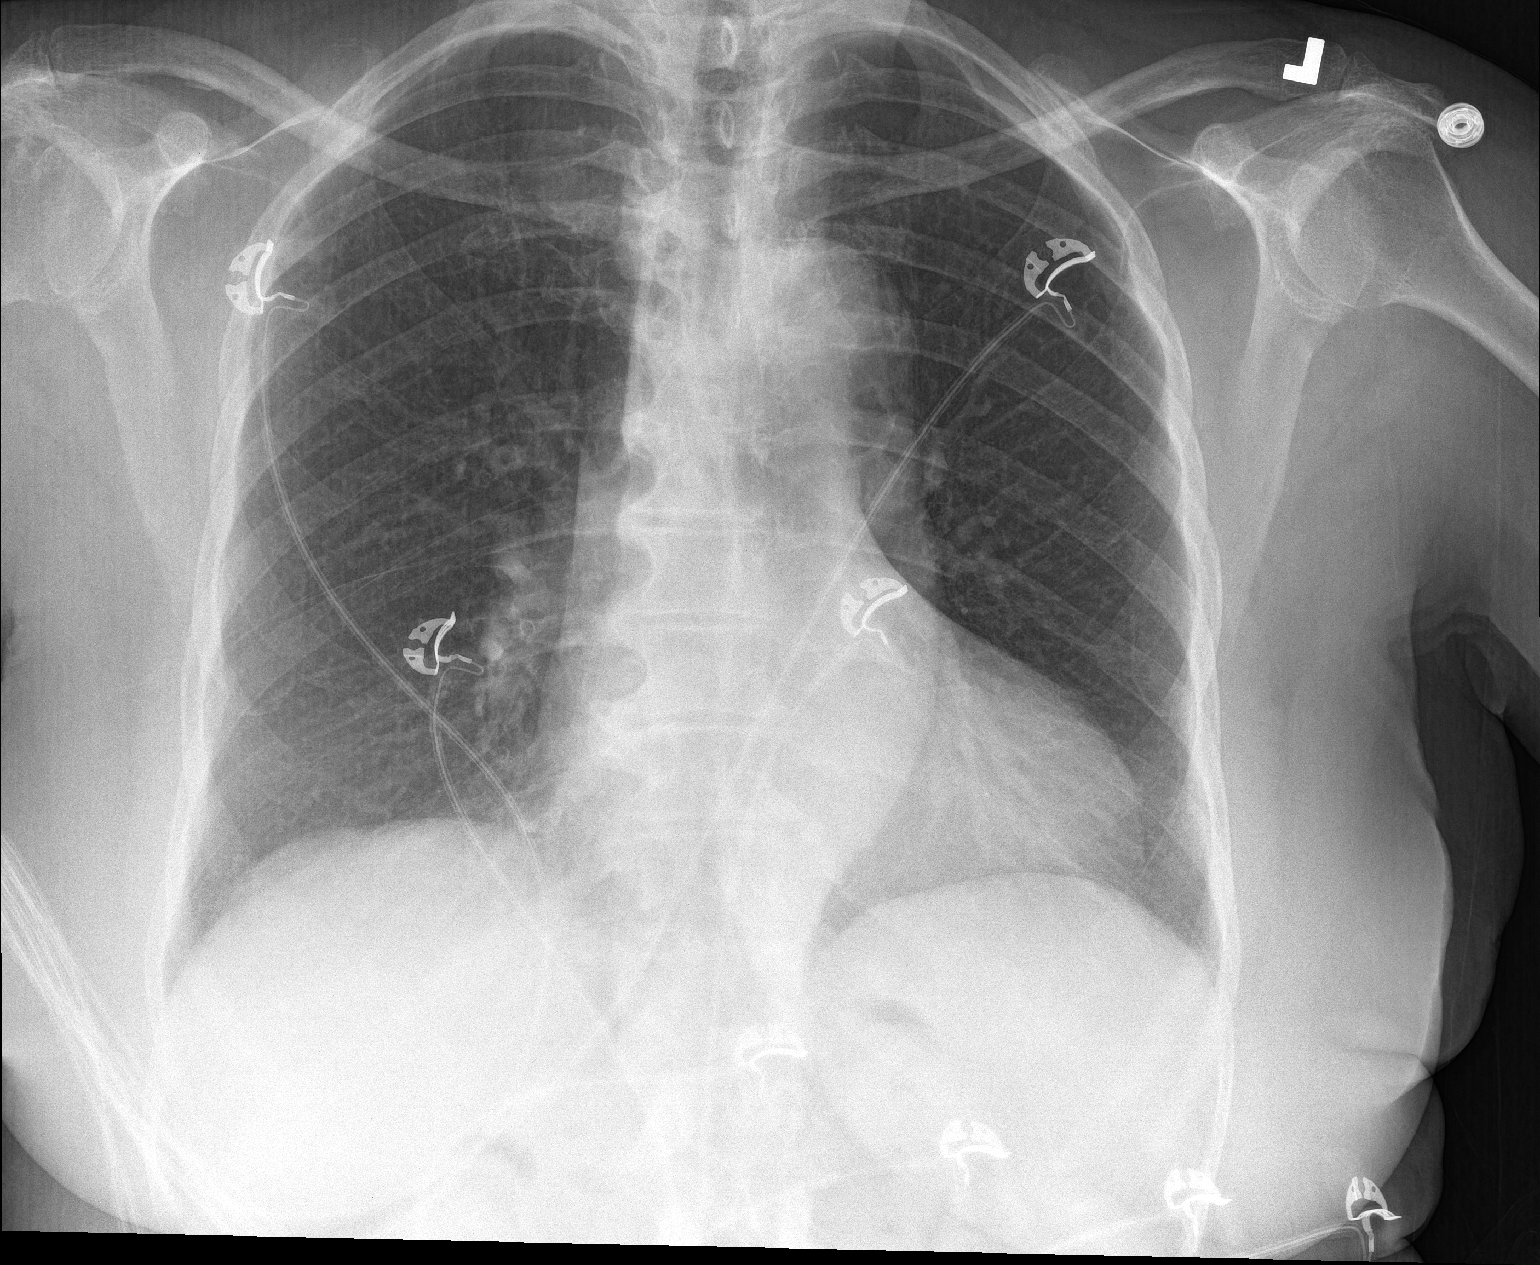

[chest lat]
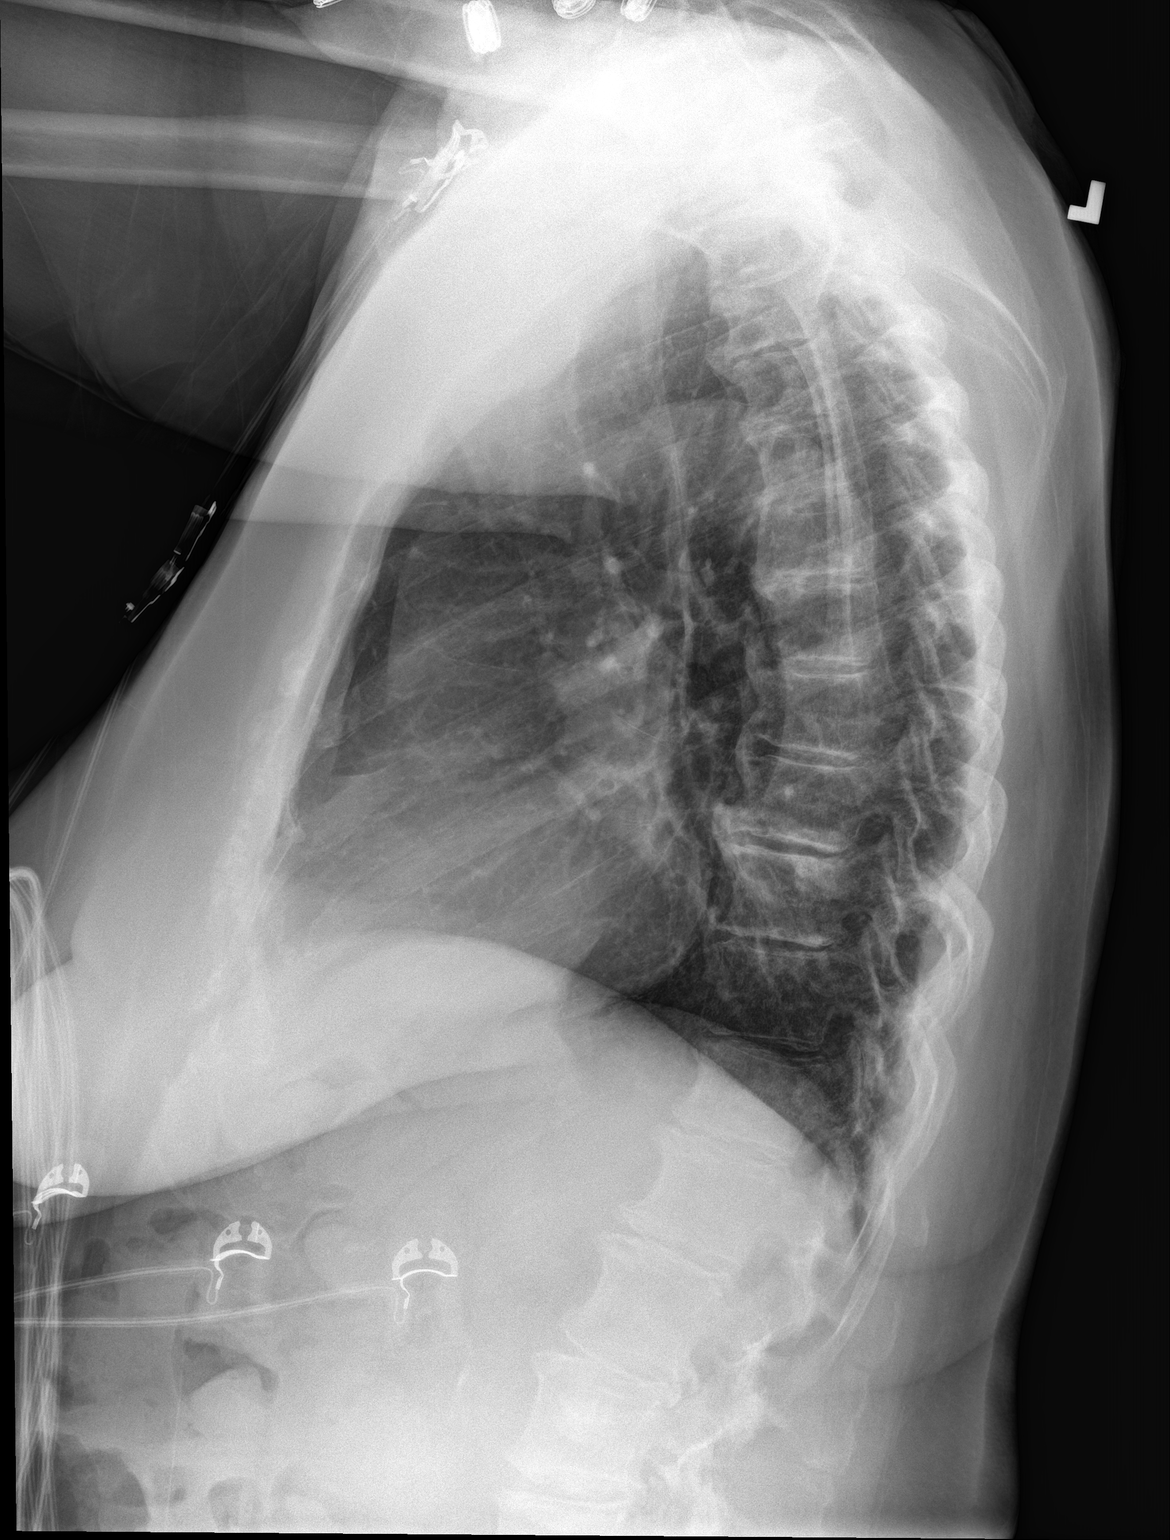

[2 of 2 positions shown; findings below may reference images not displayed]

FINDINGS: Lungs are adequately inflated without airspace consolidation or
effusion. Cardiomediastinal silhouette is within normal. There are
degenerative changes of the spine.
IMPRESSION: No active cardiopulmonary disease.

## 2019-09-12 DIAGNOSIS — I10 Essential (primary) hypertension: Secondary | ICD-10-CM | POA: Diagnosis not present

## 2019-09-12 DIAGNOSIS — E785 Hyperlipidemia, unspecified: Secondary | ICD-10-CM | POA: Diagnosis not present

## 2019-09-22 ENCOUNTER — Ambulatory Visit: Payer: Medicare Other | Attending: Internal Medicine

## 2019-09-22 DIAGNOSIS — Z23 Encounter for immunization: Secondary | ICD-10-CM

## 2019-09-22 NOTE — Progress Notes (Signed)
   Covid-19 Vaccination Clinic  Name:  Abigail Frazier    MRN: [id number] DOB: 1940/07/19  09/22/2019  Abigail Frazier was observed post Covid-19 immunization for 15 minutes without incident. She was provided with Vaccine Information Sheet and instruction to access the V-Safe system.   Abigail Frazier was instructed to call 911 with any severe reactions post vaccine: Marland Kitchen Difficulty breathing  . Swelling of face and throat  . A fast heartbeat  . A bad rash all over body  . Dizziness and weakness   Immunizations Administered    Name Date Dose VIS Date Route   Moderna COVID-19 Vaccine 09/22/2019  1:47 PM 0.5 mL 06/15/2019 Intramuscular   Manufacturer: Moderna   Lot: [id number]   [place]:[id number]

## 2019-11-09 DIAGNOSIS — I10 Essential (primary) hypertension: Secondary | ICD-10-CM | POA: Diagnosis not present

## 2019-11-12 DIAGNOSIS — I10 Essential (primary) hypertension: Secondary | ICD-10-CM | POA: Diagnosis not present

## 2019-11-12 DIAGNOSIS — E785 Hyperlipidemia, unspecified: Secondary | ICD-10-CM | POA: Diagnosis not present

## 2019-12-13 DIAGNOSIS — E785 Hyperlipidemia, unspecified: Secondary | ICD-10-CM | POA: Diagnosis not present

## 2019-12-13 DIAGNOSIS — I10 Essential (primary) hypertension: Secondary | ICD-10-CM | POA: Diagnosis not present

## 2020-02-08 DIAGNOSIS — Z0000 Encounter for general adult medical examination without abnormal findings: Secondary | ICD-10-CM | POA: Diagnosis not present

## 2020-02-08 DIAGNOSIS — E785 Hyperlipidemia, unspecified: Secondary | ICD-10-CM | POA: Diagnosis not present

## 2020-02-08 DIAGNOSIS — I10 Essential (primary) hypertension: Secondary | ICD-10-CM | POA: Diagnosis not present

## 2020-02-11 DIAGNOSIS — E785 Hyperlipidemia, unspecified: Secondary | ICD-10-CM | POA: Diagnosis not present

## 2020-02-11 DIAGNOSIS — I10 Essential (primary) hypertension: Secondary | ICD-10-CM | POA: Diagnosis not present

## 2020-03-14 DIAGNOSIS — I10 Essential (primary) hypertension: Secondary | ICD-10-CM | POA: Diagnosis not present

## 2020-03-14 DIAGNOSIS — E785 Hyperlipidemia, unspecified: Secondary | ICD-10-CM | POA: Diagnosis not present

## 2020-04-13 DIAGNOSIS — E785 Hyperlipidemia, unspecified: Secondary | ICD-10-CM | POA: Diagnosis not present

## 2020-04-13 DIAGNOSIS — I10 Essential (primary) hypertension: Secondary | ICD-10-CM | POA: Diagnosis not present

## 2020-05-13 DIAGNOSIS — E785 Hyperlipidemia, unspecified: Secondary | ICD-10-CM | POA: Diagnosis not present

## 2020-05-13 DIAGNOSIS — I10 Essential (primary) hypertension: Secondary | ICD-10-CM | POA: Diagnosis not present

## 2020-06-13 DIAGNOSIS — E785 Hyperlipidemia, unspecified: Secondary | ICD-10-CM | POA: Diagnosis not present

## 2020-06-13 DIAGNOSIS — I10 Essential (primary) hypertension: Secondary | ICD-10-CM | POA: Diagnosis not present

## 2020-06-19 DIAGNOSIS — I10 Essential (primary) hypertension: Secondary | ICD-10-CM | POA: Diagnosis not present

## 2020-06-19 DIAGNOSIS — E785 Hyperlipidemia, unspecified: Secondary | ICD-10-CM | POA: Diagnosis not present

## 2020-06-19 DIAGNOSIS — Z23 Encounter for immunization: Secondary | ICD-10-CM | POA: Diagnosis not present

## 2020-08-14 DIAGNOSIS — I10 Essential (primary) hypertension: Secondary | ICD-10-CM | POA: Diagnosis not present

## 2020-08-14 DIAGNOSIS — E785 Hyperlipidemia, unspecified: Secondary | ICD-10-CM | POA: Diagnosis not present

## 2020-11-11 DIAGNOSIS — E785 Hyperlipidemia, unspecified: Secondary | ICD-10-CM | POA: Diagnosis not present

## 2020-11-11 DIAGNOSIS — I10 Essential (primary) hypertension: Secondary | ICD-10-CM | POA: Diagnosis not present

## 2020-12-12 DIAGNOSIS — I10 Essential (primary) hypertension: Secondary | ICD-10-CM | POA: Diagnosis not present

## 2020-12-12 DIAGNOSIS — E785 Hyperlipidemia, unspecified: Secondary | ICD-10-CM | POA: Diagnosis not present

## 2021-02-11 DIAGNOSIS — I10 Essential (primary) hypertension: Secondary | ICD-10-CM | POA: Diagnosis not present

## 2021-02-11 DIAGNOSIS — E785 Hyperlipidemia, unspecified: Secondary | ICD-10-CM | POA: Diagnosis not present

## 2021-02-26 DIAGNOSIS — E785 Hyperlipidemia, unspecified: Secondary | ICD-10-CM | POA: Diagnosis not present

## 2021-02-26 DIAGNOSIS — M170 Bilateral primary osteoarthritis of knee: Secondary | ICD-10-CM | POA: Diagnosis not present

## 2021-02-26 DIAGNOSIS — M21061 Valgus deformity, not elsewhere classified, right knee: Secondary | ICD-10-CM | POA: Diagnosis not present

## 2021-02-26 DIAGNOSIS — I10 Essential (primary) hypertension: Secondary | ICD-10-CM | POA: Diagnosis not present

## 2021-03-14 DIAGNOSIS — E785 Hyperlipidemia, unspecified: Secondary | ICD-10-CM | POA: Diagnosis not present

## 2021-03-14 DIAGNOSIS — I10 Essential (primary) hypertension: Secondary | ICD-10-CM | POA: Diagnosis not present

## 2021-03-21 DIAGNOSIS — K449 Diaphragmatic hernia without obstruction or gangrene: Secondary | ICD-10-CM | POA: Diagnosis not present

## 2021-03-21 DIAGNOSIS — K8020 Calculus of gallbladder without cholecystitis without obstruction: Secondary | ICD-10-CM | POA: Diagnosis not present

## 2021-03-21 DIAGNOSIS — M19012 Primary osteoarthritis, left shoulder: Secondary | ICD-10-CM | POA: Diagnosis not present

## 2021-03-21 DIAGNOSIS — N23 Unspecified renal colic: Secondary | ICD-10-CM | POA: Diagnosis not present

## 2021-03-21 DIAGNOSIS — Z743 Need for continuous supervision: Secondary | ICD-10-CM | POA: Diagnosis not present

## 2021-03-21 DIAGNOSIS — M549 Dorsalgia, unspecified: Secondary | ICD-10-CM | POA: Diagnosis not present

## 2021-03-21 DIAGNOSIS — R161 Splenomegaly, not elsewhere classified: Secondary | ICD-10-CM | POA: Diagnosis not present

## 2021-03-21 DIAGNOSIS — D3501 Benign neoplasm of right adrenal gland: Secondary | ICD-10-CM | POA: Diagnosis not present

## 2021-03-21 DIAGNOSIS — I10 Essential (primary) hypertension: Secondary | ICD-10-CM | POA: Diagnosis not present

## 2021-03-21 DIAGNOSIS — M19011 Primary osteoarthritis, right shoulder: Secondary | ICD-10-CM | POA: Diagnosis not present

## 2021-03-21 DIAGNOSIS — I700 Atherosclerosis of aorta: Secondary | ICD-10-CM | POA: Diagnosis not present

## 2021-03-21 DIAGNOSIS — I708 Atherosclerosis of other arteries: Secondary | ICD-10-CM | POA: Diagnosis not present

## 2021-03-21 DIAGNOSIS — M545 Low back pain, unspecified: Secondary | ICD-10-CM | POA: Diagnosis not present

## 2021-03-21 DIAGNOSIS — N132 Hydronephrosis with renal and ureteral calculous obstruction: Secondary | ICD-10-CM | POA: Diagnosis not present

## 2021-04-13 DIAGNOSIS — I10 Essential (primary) hypertension: Secondary | ICD-10-CM | POA: Diagnosis not present

## 2021-04-13 DIAGNOSIS — E785 Hyperlipidemia, unspecified: Secondary | ICD-10-CM | POA: Diagnosis not present

## 2021-05-14 DIAGNOSIS — I10 Essential (primary) hypertension: Secondary | ICD-10-CM | POA: Diagnosis not present

## 2021-05-14 DIAGNOSIS — E785 Hyperlipidemia, unspecified: Secondary | ICD-10-CM | POA: Diagnosis not present

## 2021-05-21 DIAGNOSIS — E785 Hyperlipidemia, unspecified: Secondary | ICD-10-CM | POA: Diagnosis not present

## 2021-05-21 DIAGNOSIS — Z23 Encounter for immunization: Secondary | ICD-10-CM | POA: Diagnosis not present

## 2021-05-21 DIAGNOSIS — M21061 Valgus deformity, not elsewhere classified, right knee: Secondary | ICD-10-CM | POA: Diagnosis not present

## 2021-05-21 DIAGNOSIS — M170 Bilateral primary osteoarthritis of knee: Secondary | ICD-10-CM | POA: Diagnosis not present

## 2021-05-21 DIAGNOSIS — I10 Essential (primary) hypertension: Secondary | ICD-10-CM | POA: Diagnosis not present

## 2021-08-12 DIAGNOSIS — E785 Hyperlipidemia, unspecified: Secondary | ICD-10-CM | POA: Diagnosis not present

## 2021-08-12 DIAGNOSIS — I10 Essential (primary) hypertension: Secondary | ICD-10-CM | POA: Diagnosis not present

## 2021-09-18 DIAGNOSIS — I10 Essential (primary) hypertension: Secondary | ICD-10-CM | POA: Diagnosis not present

## 2021-09-18 DIAGNOSIS — E785 Hyperlipidemia, unspecified: Secondary | ICD-10-CM | POA: Diagnosis not present

## 2021-09-18 DIAGNOSIS — R600 Localized edema: Secondary | ICD-10-CM | POA: Diagnosis not present

## 2022-01-11 DIAGNOSIS — E785 Hyperlipidemia, unspecified: Secondary | ICD-10-CM | POA: Diagnosis not present

## 2022-01-11 DIAGNOSIS — I10 Essential (primary) hypertension: Secondary | ICD-10-CM | POA: Diagnosis not present

## 2022-02-19 DIAGNOSIS — N390 Urinary tract infection, site not specified: Secondary | ICD-10-CM | POA: Diagnosis not present

## 2022-02-20 ENCOUNTER — Other Ambulatory Visit: Payer: Self-pay | Admitting: Family Medicine

## 2022-02-20 DIAGNOSIS — Z87442 Personal history of urinary calculi: Secondary | ICD-10-CM

## 2022-02-20 DIAGNOSIS — N390 Urinary tract infection, site not specified: Secondary | ICD-10-CM

## 2022-02-22 DIAGNOSIS — Z87442 Personal history of urinary calculi: Secondary | ICD-10-CM | POA: Diagnosis not present

## 2022-02-22 DIAGNOSIS — N390 Urinary tract infection, site not specified: Secondary | ICD-10-CM | POA: Diagnosis not present

## 2022-02-22 DIAGNOSIS — K8020 Calculus of gallbladder without cholecystitis without obstruction: Secondary | ICD-10-CM | POA: Diagnosis not present

## 2022-02-22 DIAGNOSIS — N281 Cyst of kidney, acquired: Secondary | ICD-10-CM | POA: Diagnosis not present

## 2022-03-05 DIAGNOSIS — N390 Urinary tract infection, site not specified: Secondary | ICD-10-CM | POA: Diagnosis not present

## 2022-03-06 ENCOUNTER — Other Ambulatory Visit: Payer: Medicare Other

## 2022-03-09 ENCOUNTER — Encounter (HOSPITAL_COMMUNITY): Payer: Self-pay | Admitting: Emergency Medicine

## 2022-03-09 ENCOUNTER — Inpatient Hospital Stay (HOSPITAL_COMMUNITY)
Admission: EM | Admit: 2022-03-09 | Discharge: 2022-03-14 | DRG: 419 | Disposition: A | Discharge status: Home / Self Care | Payer: Medicare HMO | Attending: Family Medicine | Admitting: Family Medicine

## 2022-03-09 ENCOUNTER — Emergency Department (HOSPITAL_COMMUNITY): Payer: Medicare HMO

## 2022-03-09 ENCOUNTER — Other Ambulatory Visit: Payer: Self-pay

## 2022-03-09 DIAGNOSIS — Z79899 Other long term (current) drug therapy: Secondary | ICD-10-CM | POA: Diagnosis not present

## 2022-03-09 DIAGNOSIS — R112 Nausea with vomiting, unspecified: Secondary | ICD-10-CM

## 2022-03-09 DIAGNOSIS — N281 Cyst of kidney, acquired: Secondary | ICD-10-CM | POA: Diagnosis not present

## 2022-03-09 DIAGNOSIS — D3501 Benign neoplasm of right adrenal gland: Secondary | ICD-10-CM | POA: Diagnosis present

## 2022-03-09 DIAGNOSIS — I10 Essential (primary) hypertension: Secondary | ICD-10-CM | POA: Diagnosis present

## 2022-03-09 DIAGNOSIS — R1033 Periumbilical pain: Secondary | ICD-10-CM | POA: Diagnosis not present

## 2022-03-09 DIAGNOSIS — E876 Hypokalemia: Secondary | ICD-10-CM | POA: Diagnosis present

## 2022-03-09 DIAGNOSIS — R935 Abnormal findings on diagnostic imaging of other abdominal regions, including retroperitoneum: Secondary | ICD-10-CM | POA: Diagnosis not present

## 2022-03-09 DIAGNOSIS — K810 Acute cholecystitis: Secondary | ICD-10-CM | POA: Diagnosis not present

## 2022-03-09 DIAGNOSIS — R1011 Right upper quadrant pain: Secondary | ICD-10-CM | POA: Diagnosis present

## 2022-03-09 DIAGNOSIS — K8000 Calculus of gallbladder with acute cholecystitis without obstruction: Principal | ICD-10-CM | POA: Diagnosis present

## 2022-03-09 DIAGNOSIS — R739 Hyperglycemia, unspecified: Secondary | ICD-10-CM | POA: Diagnosis present

## 2022-03-09 DIAGNOSIS — R140 Abdominal distension (gaseous): Secondary | ICD-10-CM | POA: Diagnosis not present

## 2022-03-09 DIAGNOSIS — E782 Mixed hyperlipidemia: Secondary | ICD-10-CM | POA: Diagnosis present

## 2022-03-09 DIAGNOSIS — K219 Gastro-esophageal reflux disease without esophagitis: Secondary | ICD-10-CM | POA: Diagnosis present

## 2022-03-09 DIAGNOSIS — R161 Splenomegaly, not elsewhere classified: Secondary | ICD-10-CM | POA: Diagnosis present

## 2022-03-09 DIAGNOSIS — E278 Other specified disorders of adrenal gland: Secondary | ICD-10-CM | POA: Diagnosis not present

## 2022-03-09 DIAGNOSIS — D7389 Other diseases of spleen: Secondary | ICD-10-CM | POA: Diagnosis not present

## 2022-03-09 DIAGNOSIS — I700 Atherosclerosis of aorta: Secondary | ICD-10-CM | POA: Diagnosis not present

## 2022-03-09 DIAGNOSIS — K82A1 Gangrene of gallbladder in cholecystitis: Secondary | ICD-10-CM | POA: Diagnosis not present

## 2022-03-09 DIAGNOSIS — K8020 Calculus of gallbladder without cholecystitis without obstruction: Secondary | ICD-10-CM | POA: Diagnosis not present

## 2022-03-09 DIAGNOSIS — Z8719 Personal history of other diseases of the digestive system: Secondary | ICD-10-CM | POA: Diagnosis not present

## 2022-03-09 DIAGNOSIS — R109 Unspecified abdominal pain: Secondary | ICD-10-CM | POA: Diagnosis present

## 2022-03-09 LAB — LIPASE, BLOOD: Lipase: 21 U/L (ref 11–51)

## 2022-03-09 LAB — COMPREHENSIVE METABOLIC PANEL
ALT: 11 U/L (ref 0–44)
AST: 14 U/L — ABNORMAL LOW (ref 15–41)
Albumin: 4.6 g/dL (ref 3.5–5.0)
Alkaline Phosphatase: 69 U/L (ref 38–126)
Anion gap: 11 (ref 5–15)
BUN: 12 mg/dL (ref 8–23)
CO2: 23 mmol/L (ref 22–32)
Calcium: 8.8 mg/dL — ABNORMAL LOW (ref 8.9–10.3)
Chloride: 105 mmol/L (ref 98–111)
Creatinine, Ser: 0.86 mg/dL (ref 0.44–1.00)
GFR, Estimated: >60 mL/min (ref >60)
Glucose, Bld: 185 mg/dL — ABNORMAL HIGH (ref 70–99)
Potassium: 3.1 mmol/L — ABNORMAL LOW (ref 3.5–5.1)
Sodium: 139 mmol/L (ref 135–145)
Total Bilirubin: 1.6 mg/dL — ABNORMAL HIGH (ref 0.3–1.2)
Total Protein: 7.6 g/dL (ref 6.5–8.1)

## 2022-03-09 LAB — CBC
HCT: 42.7 % (ref 36.0–46.0)
Hemoglobin: 14 g/dL (ref 12.0–15.0)
MCH: 28.2 pg (ref 26.0–34.0)
MCHC: 32.8 g/dL (ref 30.0–36.0)
MCV: 85.9 fL (ref 80.0–100.0)
Platelets: 255 10*3/uL (ref 150–400)
RBC: 4.97 MIL/uL (ref 3.87–5.11)
RDW: 15.9 % — ABNORMAL HIGH (ref 11.5–15.5)
WBC: 17.4 10*3/uL — ABNORMAL HIGH (ref 4.0–10.5)
nRBC: 0 % (ref 0.0–0.2)

## 2022-03-09 MED ORDER — IOHEXOL 300 MG/ML  SOLN
100.0000 mL | Freq: Once | INTRAMUSCULAR | Status: AC | PRN
  Administered 2022-03-10: 100 mL via INTRAVENOUS

## 2022-03-09 NOTE — ED Triage Notes (Signed)
Pt to the ED with complaints of stomach pain and nausea with vomiting for the past two hours.

## 2022-03-10 ENCOUNTER — Inpatient Hospital Stay (HOSPITAL_COMMUNITY): Payer: Medicare HMO

## 2022-03-10 DIAGNOSIS — E782 Mixed hyperlipidemia: Secondary | ICD-10-CM | POA: Diagnosis present

## 2022-03-10 DIAGNOSIS — R1011 Right upper quadrant pain: Secondary | ICD-10-CM | POA: Diagnosis present

## 2022-03-10 DIAGNOSIS — R1033 Periumbilical pain: Secondary | ICD-10-CM | POA: Diagnosis not present

## 2022-03-10 DIAGNOSIS — R112 Nausea with vomiting, unspecified: Secondary | ICD-10-CM

## 2022-03-10 DIAGNOSIS — I10 Essential (primary) hypertension: Secondary | ICD-10-CM | POA: Diagnosis present

## 2022-03-10 DIAGNOSIS — N281 Cyst of kidney, acquired: Secondary | ICD-10-CM | POA: Diagnosis not present

## 2022-03-10 DIAGNOSIS — D7389 Other diseases of spleen: Secondary | ICD-10-CM | POA: Diagnosis not present

## 2022-03-10 DIAGNOSIS — K219 Gastro-esophageal reflux disease without esophagitis: Secondary | ICD-10-CM | POA: Diagnosis present

## 2022-03-10 DIAGNOSIS — R739 Hyperglycemia, unspecified: Secondary | ICD-10-CM | POA: Diagnosis present

## 2022-03-10 DIAGNOSIS — R140 Abdominal distension (gaseous): Secondary | ICD-10-CM | POA: Diagnosis not present

## 2022-03-10 DIAGNOSIS — Z8719 Personal history of other diseases of the digestive system: Secondary | ICD-10-CM | POA: Diagnosis not present

## 2022-03-10 DIAGNOSIS — R161 Splenomegaly, not elsewhere classified: Secondary | ICD-10-CM | POA: Diagnosis present

## 2022-03-10 DIAGNOSIS — R109 Unspecified abdominal pain: Secondary | ICD-10-CM | POA: Diagnosis present

## 2022-03-10 DIAGNOSIS — D3501 Benign neoplasm of right adrenal gland: Secondary | ICD-10-CM | POA: Diagnosis present

## 2022-03-10 DIAGNOSIS — E876 Hypokalemia: Secondary | ICD-10-CM | POA: Diagnosis present

## 2022-03-10 DIAGNOSIS — Z79899 Other long term (current) drug therapy: Secondary | ICD-10-CM | POA: Diagnosis not present

## 2022-03-10 DIAGNOSIS — E278 Other specified disorders of adrenal gland: Secondary | ICD-10-CM | POA: Diagnosis not present

## 2022-03-10 DIAGNOSIS — K810 Acute cholecystitis: Secondary | ICD-10-CM | POA: Diagnosis not present

## 2022-03-10 DIAGNOSIS — I700 Atherosclerosis of aorta: Secondary | ICD-10-CM | POA: Diagnosis not present

## 2022-03-10 DIAGNOSIS — K8020 Calculus of gallbladder without cholecystitis without obstruction: Secondary | ICD-10-CM | POA: Diagnosis not present

## 2022-03-10 DIAGNOSIS — K8000 Calculus of gallbladder with acute cholecystitis without obstruction: Secondary | ICD-10-CM | POA: Diagnosis present

## 2022-03-10 LAB — URINALYSIS, ROUTINE W REFLEX MICROSCOPIC
Bilirubin Urine: NEGATIVE
Glucose, UA: NEGATIVE mg/dL
Ketones, ur: NEGATIVE mg/dL
Leukocytes,Ua: NEGATIVE
Nitrite: NEGATIVE
Protein, ur: 30 mg/dL — AB
Specific Gravity, Urine: 1.031 — ABNORMAL HIGH (ref 1.005–1.030)
pH: 7 (ref 5.0–8.0)

## 2022-03-10 LAB — CBC
HCT: 41 % (ref 36.0–46.0)
Hemoglobin: 13.9 g/dL (ref 12.0–15.0)
MCH: 29 pg (ref 26.0–34.0)
MCHC: 33.9 g/dL (ref 30.0–36.0)
MCV: 85.4 fL (ref 80.0–100.0)
Platelets: 232 10*3/uL (ref 150–400)
RBC: 4.8 MIL/uL (ref 3.87–5.11)
RDW: 15.8 % — ABNORMAL HIGH (ref 11.5–15.5)
WBC: 20.3 10*3/uL — ABNORMAL HIGH (ref 4.0–10.5)
nRBC: 0 % (ref 0.0–0.2)

## 2022-03-10 LAB — COMPREHENSIVE METABOLIC PANEL
ALT: 11 U/L (ref 0–44)
AST: 15 U/L (ref 15–41)
Albumin: 4.3 g/dL (ref 3.5–5.0)
Alkaline Phosphatase: 71 U/L (ref 38–126)
Anion gap: 8 (ref 5–15)
BUN: 9 mg/dL (ref 8–23)
CO2: 27 mmol/L (ref 22–32)
Calcium: 8.9 mg/dL (ref 8.9–10.3)
Chloride: 102 mmol/L (ref 98–111)
Creatinine, Ser: 0.64 mg/dL (ref 0.44–1.00)
GFR, Estimated: >60 mL/min (ref >60)
Glucose, Bld: 125 mg/dL — ABNORMAL HIGH (ref 70–99)
Potassium: 3.8 mmol/L (ref 3.5–5.1)
Sodium: 137 mmol/L (ref 135–145)
Total Bilirubin: 2 mg/dL — ABNORMAL HIGH (ref 0.3–1.2)
Total Protein: 7.5 g/dL (ref 6.5–8.1)

## 2022-03-10 LAB — MAGNESIUM: Magnesium: 1.8 mg/dL (ref 1.7–2.4)

## 2022-03-10 LAB — PHOSPHORUS: Phosphorus: 2.3 mg/dL — ABNORMAL LOW (ref 2.5–4.6)

## 2022-03-10 MED ORDER — POTASSIUM CHLORIDE 10 MEQ/100ML IV SOLN
10.0000 meq | INTRAVENOUS | Status: AC
  Administered 2022-03-10 (×2): 10 meq via INTRAVENOUS
  Filled 2022-03-10 (×2): qty 100

## 2022-03-10 MED ORDER — ONDANSETRON HCL 4 MG/2ML IJ SOLN
4.0000 mg | Freq: Four times a day (QID) | INTRAMUSCULAR | Status: DC | PRN
  Administered 2022-03-10: 4 mg via INTRAVENOUS
  Filled 2022-03-10: qty 2

## 2022-03-10 MED ORDER — FENTANYL CITRATE PF 50 MCG/ML IJ SOSY
50.0000 ug | PREFILLED_SYRINGE | INTRAMUSCULAR | Status: DC | PRN
  Administered 2022-03-10 (×2): 50 ug via INTRAVENOUS
  Filled 2022-03-10 (×2): qty 1

## 2022-03-10 MED ORDER — MORPHINE SULFATE (PF) 2 MG/ML IV SOLN: 2.0000 mg | INTRAVENOUS | Status: DC | PRN

## 2022-03-10 MED ORDER — HYDRALAZINE HCL 20 MG/ML IJ SOLN: 10.0000 mg | Freq: Four times a day (QID) | INTRAMUSCULAR | Status: DC | PRN

## 2022-03-10 MED ORDER — PIPERACILLIN-TAZOBACTAM 3.375 G IVPB
3.3750 g | Freq: Three times a day (TID) | INTRAVENOUS | Status: DC
  Administered 2022-03-10 – 2022-03-14 (×13): 3.375 g via INTRAVENOUS
  Filled 2022-03-10 (×12): qty 50

## 2022-03-10 MED ORDER — ACETAMINOPHEN 325 MG PO TABS: 650.0000 mg | ORAL_TABLET | ORAL | Status: DC | PRN

## 2022-03-10 MED ORDER — LACTATED RINGERS IV SOLN: INTRAVENOUS | Status: DC

## 2022-03-10 MED ORDER — PANTOPRAZOLE SODIUM 40 MG IV SOLR
40.0000 mg | INTRAVENOUS | Status: DC
  Administered 2022-03-10 – 2022-03-14 (×5): 40 mg via INTRAVENOUS
  Filled 2022-03-10 (×5): qty 10

## 2022-03-10 MED ORDER — SODIUM CHLORIDE 0.9 % IV SOLN: INTRAVENOUS | Status: DC

## 2022-03-10 MED ORDER — PIPERACILLIN-TAZOBACTAM 3.375 G IVPB 30 MIN
3.3750 g | Freq: Once | INTRAVENOUS | Status: AC
  Administered 2022-03-10: 3.375 g via INTRAVENOUS
  Filled 2022-03-10: qty 50

## 2022-03-10 NOTE — Consult Note (Signed)
[facility] Consult  Reason for Consult: Acute cholecystitis Referring Physician: Dr. Dayna Barker  Chief Complaint   Abdominal Pain     HPI: Abigail Frazier is a 81 y.o. female who presents with a less than 1 day history of epigastric abdominal pain, nausea, and vomiting.  She states that she ate her breakfast in the morning, and 2 hours later, she had worsening abdominal pain.  She does confirm nausea with an episode of emesis.  She denies ever having symptoms like this in the past.  Her past medical history is significant for hypertension and hyperlipidemia.  Her abdominal surgeries include appendectomy and hysterectomy.  She denies use of blood thinning medications.  In the ED, she underwent CT abdomen and pelvis which demonstrated a distended gallbladder with small amount of pericholecystic fluid and mild wall thickening, concerning for possible acute cholecystitis.  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  She was noted to have a leukocytosis of 17.4.  Upon evaluation this morning, the patient's abdominal pain, nausea, and vomiting have since resolved.  Past Medical History:  Diagnosis Date   Balance problem    High cholesterol    Hypertension    Low back pain    Palpitations    Rotator cuff tendinitis, left    Sciatica     Past Surgical History:  Procedure Laterality Date   ABDOMINAL HYSTERECTOMY     GLAUCOMA SURGERY Left 10/2017    History reviewed. No pertinent family history.  Social History   Tobacco Use   Smoking status: Never   Smokeless tobacco: Never  Vaping Use   Vaping Use: Never used  Substance Use Topics   Alcohol use: Never   Drug use: Never    Medications: I have reviewed the patient's current medications.  No Known Allergies   ROS:  Constitutional: negative for chills, fatigue, and  fevers Respiratory: negative for shortness of breath Cardiovascular: negative for chest pain Gastrointestinal: negative for abdominal pain, nausea, and vomiting  Blood pressure (!) 144/79, pulse 70, temperature 98.4 F (36.9 C), temperature source Oral, resp. rate (!) 32, height '5\' 6"'$  (1.676 m), weight 74.8 kg, SpO2 99 %. Physical Exam Vitals reviewed.  Constitutional:      Appearance: She is well-developed.  HENT:     Head: Normocephalic and atraumatic.  Eyes:     Extraocular Movements: Extraocular movements intact.     Pupils: Pupils are equal, round, and reactive to light.  Cardiovascular:     Rate and Rhythm: Normal rate.  Pulmonary:     Effort: Pulmonary effort is normal.  Abdominal:     Comments: Abdomen soft, nondistended, no percussion tenderness, nontender to palpation; no rigidity, guarding, rebound tenderness; negative Murphy sign  Skin:    General: Skin is warm and dry.  Neurological:     General: No focal deficit present.     Mental Status: She is alert and oriented to person, place, and time.  Psychiatric:        Mood and Affect: Mood normal.        Behavior: Behavior normal.     Results: Results for orders placed or performed during the hospital encounter of 03/09/22 (from the past 48 hour(s))  Lipase, blood     Status: None   Collection Time: 03/09/22  5:59 PM  Result Value Ref Range   Lipase 21 11 - 51 U/L    Comment:  Performed at [facility], [address], [place], [place] [address]  Comprehensive metabolic panel     Status: Abnormal   Collection Time: 03/09/22  5:59 PM  Result Value Ref Range   Sodium 139 135 - 145 mmol/L   Potassium 3.1 (L) 3.5 - 5.1 mmol/L   Chloride 105 98 - 111 mmol/L   CO2 23 22 - 32 mmol/L   Glucose, Bld 185 (H) 70 - 99 mg/dL    Comment: Glucose reference range applies only to samples taken after fasting for at least 8 hours.   BUN 12 8 - 23 mg/dL   Creatinine, Ser 0.86 0.44 - 1.00 mg/dL   Calcium 8.8 (L) 8.9 - 10.3 mg/dL    Total Protein 7.6 6.5 - 8.1 g/dL   Albumin 4.6 3.5 - 5.0 g/dL   AST 14 (L) 15 - 41 U/L   ALT 11 0 - 44 U/L   Alkaline Phosphatase 69 38 - 126 U/L   Total Bilirubin 1.6 (H) 0.3 - 1.2 mg/dL   GFR, Estimated >60 >60 mL/min    Comment: (NOTE) Calculated using the CKD-EPI Creatinine Equation (2021)    Anion gap 11 5 - 15    Comment: Performed at [facility], [address], [place], [place] [address]  CBC     Status: Abnormal   Collection Time: 03/09/22  5:59 PM  Result Value Ref Range   WBC 17.4 (H) 4.0 - 10.5 K/uL   RBC 4.97 3.87 - 5.11 MIL/uL   Hemoglobin 14.0 12.0 - 15.0 g/dL   HCT 42.7 36.0 - 46.0 %   MCV 85.9 80.0 - 100.0 fL   MCH 28.2 26.0 - 34.0 pg   MCHC 32.8 30.0 - 36.0 g/dL   RDW 15.9 (H) 11.5 - 15.5 %   Platelets 255 150 - 400 K/uL   nRBC 0.0 0.0 - 0.2 %    Comment: Performed at [facility], [address], [place], [place] [address]  Urinalysis, Routine w reflex microscopic Urine, Clean Catch     Status: Abnormal   Collection Time: 03/10/22 12:37 AM  Result Value Ref Range   Color, Urine YELLOW YELLOW   APPearance CLEAR CLEAR   Specific Gravity, Urine 1.031 (H) 1.005 - 1.030   pH 7.0 5.0 - 8.0   Glucose, UA NEGATIVE NEGATIVE mg/dL   Hgb urine dipstick SMALL (A) NEGATIVE   Bilirubin Urine NEGATIVE NEGATIVE   Ketones, ur NEGATIVE NEGATIVE mg/dL   Protein, ur 30 (A) NEGATIVE mg/dL   Nitrite NEGATIVE NEGATIVE   Leukocytes,Ua NEGATIVE NEGATIVE   RBC / HPF 6-10 0 - 5 RBC/hpf   WBC, UA 0-5 0 - 5 WBC/hpf   Bacteria, UA RARE (A) NONE SEEN   Squamous Epithelial / LPF 0-5 0 - 5    Comment: Performed at [facility], [address] [place], [place] [address]  Comprehensive metabolic panel     Status: Abnormal   Collection Time: 03/10/22  5:42 AM  Result Value Ref Range   Sodium 137 135 - 145 mmol/L   Potassium 3.8 3.5 - 5.1 mmol/L    Comment: DELTA CHECK NOTED   Chloride 102 98 - 111 mmol/L   CO2 27 22 - 32 mmol/L   Glucose, Bld 125 (H) 70 - 99 mg/dL    Comment:  Glucose reference range applies only to samples taken after fasting for at least 8 hours.   BUN 9 8 - 23 mg/dL   Creatinine, Ser 0.64 0.44 - 1.00 mg/dL  Calcium 8.9 8.9 - 10.3 mg/dL   Total Protein 7.5 6.5 - 8.1 g/dL   Albumin 4.3 3.5 - 5.0 g/dL   AST 15 15 - 41 U/L   ALT 11 0 - 44 U/L   Alkaline Phosphatase 71 38 - 126 U/L   Total Bilirubin 2.0 (H) 0.3 - 1.2 mg/dL   GFR, Estimated >60 >60 mL/min    Comment: (NOTE) Calculated using the CKD-EPI Creatinine Equation (2021)    Anion gap 8 5 - 15    Comment: Performed at [facility], [address], [place], [place] [address]  CBC     Status: Abnormal   Collection Time: 03/10/22  5:42 AM  Result Value Ref Range   WBC 20.3 (H) 4.0 - 10.5 K/uL   RBC 4.80 3.87 - 5.11 MIL/uL   Hemoglobin 13.9 12.0 - 15.0 g/dL   HCT 41.0 36.0 - 46.0 %   MCV 85.4 80.0 - 100.0 fL   MCH 29.0 26.0 - 34.0 pg   MCHC 33.9 30.0 - 36.0 g/dL   RDW 15.8 (H) 11.5 - 15.5 %   Platelets 232 150 - 400 K/uL   nRBC 0.0 0.0 - 0.2 %    Comment: Performed at [facility], [address], [place], [place] [address]  Magnesium     Status: None   Collection Time: 03/10/22  5:42 AM  Result Value Ref Range   Magnesium 1.8 1.7 - 2.4 mg/dL    Comment: Performed at [facility], [address] [place], [place] [address]  Phosphorus     Status: Abnormal   Collection Time: 03/10/22  5:42 AM  Result Value Ref Range   Phosphorus 2.3 (L) 2.5 - 4.6 mg/dL    Comment: Performed at [facility], [address], [place],  [address]    US Abdomen Limited  Result Date: 03/10/2022 CLINICAL DATA:  Mid abdominal pain. Findings concerning for acute cholecystitis on CT imaging. EXAM: ULTRASOUND ABDOMEN LIMITED RIGHT UPPER QUADRANT COMPARISON:  CT of the abdomen and pelvis March 10, 2022 FINDINGS: Gallbladder: Numerous tiny stones in the gallbladder without wall thickening, pericholecystic fluid, or Murphy's sign. Common bile duct: Diameter: 3.5 mm Liver: No focal lesion identified.  Within normal limits in parenchymal echogenicity. Portal vein is patent on color Doppler imaging with normal direction of blood flow towards the liver. Other: None. IMPRESSION: 1. Cholelithiasis without wall thickening, pericholecystic fluid, or Murphy's sign. On the CT scan from earlier today, there is distension of the gallbladder and mild increased attenuation in the pericholecystic fat. As result, the findings on the ultrasound do not demonstrate evidence of acute cholecystitis but the findings on CT imaging are concerning for acute cholecystitis. Recommend clinical correlation. If the clinical picture remains ambiguous, recommend a HIDA scan for further evaluation. 2. No other abnormalities. Electronically Signed   By: Dorise Bullion III M.D.   On: 03/10/2022 10:24   CT ABDOMEN PELVIS W CONTRAST  Result Date: 03/10/2022 CLINICAL DATA:  Generalized abdominal pain with nausea. EXAM: CT ABDOMEN AND PELVIS WITH CONTRAST TECHNIQUE: Multidetector CT imaging of the abdomen and pelvis was performed using the standard protocol following bolus administration of intravenous contrast. RADIATION DOSE REDUCTION: This exam was performed according to the departmental dose-optimization program which includes automated exposure control, adjustment of the mA and/or kV according to patient size and/or use of iterative reconstruction technique. CONTRAST:  123m OMNIPAQUE IOHEXOL 300 MG/ML  SOLN COMPARISON:  None Available. FINDINGS: Lower chest: Lung bases show posterior atelectasis but no infiltrates.  There is mild cardiomegaly. No pericardial effusion. Small to moderate size hiatal hernia. Hepatobiliary: The liver is 20 cm length slightly steatotic. There is no mass enhancement. The gallbladder is distended up to 10 cm with tiny stones layering posteriorly and there is slight gallbladder wall thickening and pericholecystic edema concerning for acute cholecystitis. There is no biliary dilatation. Pancreas: Unremarkable.  Spleen: On the first series postcontrast there is a 9.1 x 7.8 x 9 cm heterogeneous mass in the superomedial aspect of the spleen. In the delayed images this is more uniform in attenuation suggesting this may represent a hemangioma. MRI without and with contrast is recommended for further evaluation. The spleen mildly enlarged overall at 14.7 cm in length without other focal abnormality. Adrenals/Urinary Tract: Heterogeneous right adrenal mass of 2.9 x 1.8 cm 91 Hounsfield units. The left adrenal gland are unremarkable. There is a 1.2 cm cyst in the superior pole of the right kidney of 10.2 Hounsfield units and a 1.1 cm cyst in the extreme inferior pole of this kidney 7.6 Hounsfield units. There are few tiny right renal cortical hypodensities which are too small to characterize. The left kidney is unremarkable. There is no calculus or urinary obstruction. The ureters insert low in the pelvis consistent with pelvic floor laxity but no cystocele is seen. The bladder is unremarkable. Stomach/Bowel: No dilatation or wall thickening. An appendix is not seen in this patient. Vascular/Lymphatic: There is moderately heavy aortoiliac calcific plaque. No AAA or dissection. No critical stenosis. Reproductive: Status post hysterectomy. No adnexal masses. Other: Mild umbilical rectus diastasis and small umbilical fat hernia. There is no incarcerated hernia. There is no free air, free hemorrhage or free fluid. Musculoskeletal: There is levoscoliosis, degenerative change in osteopenia lumbar spine. Mild hip DJD. Incidentally noted 7.5 x 3.7 by 9.2 cm right gluteus minimis muscle lipoma. Severe acquired spinal stenosis L4-5 due to disc osteophyte complex, ligamentous and facet hypertrophy. IMPRESSION: 1. Cholelithiasis and findings concerning for acute cholecystitis. No biliary dilatation. 2. 9.1 x 7.8 x 9 cm heterogeneous splenic mass on the initial images, more homogeneous attenuation in the delayed phase. 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 MRI  without and with contrast recommended to confirm a benign entity. 3. 2.9 x 1.8 cm heterogeneous right adrenal mass. Recommend biochemical lab evaluation for pheochromocytoma. If lab values normal, recommend adrenal washout CT or chemical shift MR. JACR 2017 Aug; 14(8):1038-44, JCAT 2016 Mar-Apr; 40(2):194-200, Urol J 2006 Spring; 3(2):71-4. 4. Small right renal cysts and additional hypodensities which are too small to characterize. No follow-up imaging is recommended. JACR 2018 Feb; 264-273, Management of the Incidental RenalMass on CT, [employer] 2021; 814-848, Bosniak Classification of Cystic Renal Masses, Version 2019. 5. Small/moderate size hiatal hernia. Umbilical rectus diastasis and small umbilical fat hernia. 6. Cardiomegaly, with aortic atherosclerosis. 7. Sizable right gluteus minimus lipoma. Osteopenia and degenerative change. 8. Pelvic floor laxity without evidence of cystocele 9. Severe acquired spinal stenosis L4-5. Electronically Signed   By: Telford Nab M.D.   On: 03/10/2022 00:41     Assessment & Plan:  Abigail Frazier is a 81 y.o. female who was admitted with concerns for acute cholecystitis.  CT abdomen and pelvis demonstrated distended gallbladder with mild wall thickening and pericholecystic fluid, possibly concerning for acute cholecystitis.  WBC 17.4.  -Abdominal ultrasound performed this morning demonstrates distention of the gallbladder without wall thickening, pericholecystic fluid, or Murphy sign. -Patient's abdominal exam also very benign -We will obtain HIDA scan to fully differentiate if patient has acute cholecystitis or not -Given the  CT findings of a right adrenal mass which could possibly be pheochromocytoma, we need to wait for plasma metanephrines to return prior to any surgical intervention -Patient may end up with percutaneous cholecystostomy tube if this adrenal mass is a pheochromocytoma or if further work-up is needed and HIDA is consistent with acute  cholecystitis -Continue IV Zosyn -Okay for low-fat diet and NPO in AM for HIDA scan -Minimize use of narcotics for HIDA scan tomorrow -Repeat labs in AM -Further recommendations to follow imaging -Appreciate hospitalist recommendations  All questions were answered to the satisfaction of the patient and family.  -- Graciella Freer, DO [facility] [address] [place], [place] [address] [phone] (office)

## 2022-03-10 NOTE — Progress Notes (Signed)
Pt seen and examined at bedside.  She was admitted earlier this am by Dr Josephine Cables, please see detailed H&P .   Abigail Frazier is a 81 y.o. female with medical history significant of essential hypertension, hyperlipidemia, GERD who presents to the emergency department due to abdominal pain which started yesterday in the evening while watching TV.  Abdominal pain was in the periumbilical area and was associated with nausea and vomiting.    CT abdomen and pelvis with contrast showed: 1.  Cholelithiasis and findings concerning for acute cholecystitis.  No biliary dilatation 2. 9.1 x 7.8 x 9 cm heterogeneous splenic mass on the initial images, more homogeneous attenuation in the delayed phase. 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 MRI without and with contrast recommended to confirm a benign entity. 3. 2.9 x 1.8 cm heterogeneous right adrenal mass. Recommend biochemical lab evaluation for  pheochromocytoma Patient was empirically started on IV Zosyn, IV fentanyl was given.  Zofran was given.  Hospitalist was asked to admit patient for further evaluation and management.    Surgery consulted,  Plan for HIDA SCAN in am.  Npo after might, no narcotics.    General exam: Appears calm and comfortable  Respiratory system: Clear to auscultation. Respiratory effort normal. Cardiovascular system: S1 & S2 heard, RRR.  Gastrointestinal system: Abdomen is soft, mild gen tenderness.  Normal bowel sounds heard. Central nervous system: Alert and oriented.  Extremities: Symmetric 5 x 5 power. Skin: No rashes,  Psychiatry: Mood & affect appropriate.    Hosie Poisson, MD

## 2022-03-10 NOTE — ED Notes (Signed)
Per Nuc Med, Pt must be NPO and no narcotic administration after midnight.

## 2022-03-10 NOTE — H&P (Signed)
History and Physical    Patient: Abigail Frazier WPY:[id number] DOB: [birth year] DOA: 03/09/2022 DOS: the patient was seen and examined on 03/10/2022 PCP: Iona Beard, MD  Patient coming from: Home  Chief Complaint:  Chief Complaint  Patient presents with   Abdominal Pain   HPI: Abigail Frazier is a 81 y.o. female with medical history significant of essential hypertension, hyperlipidemia, GERD who presents to the emergency department due to abdominal pain which started yesterday in the evening while watching TV.  Abdominal pain was in the periumbilical area and was associated with nausea and vomiting.  Pain was rated as 10/10 on pain scale, it was aggravated with sitting upright and only elevated with the pain medication given in the ED.  She denies chest pain shortness of breath, fever, chills, diarrhea.   ED Course:  In the emergency department, he was intermittently tachypneic, BP was 165/87 and other vital signs were within normal range.  Work-up in the ED showed normal CBC except leukocytosis with WBC of 17.4, BMP was normal except for potassium 3.1, CBG 185.  Lipase 21, urinalysis was unimpressive for UTI CT abdomen and pelvis with contrast showed: 1.  Cholelithiasis and findings concerning for acute cholecystitis.  No biliary dilatation 2. 9.1 x 7.8 x 9 cm heterogeneous splenic mass on the initial images, more homogeneous attenuation in the delayed phase. 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 MRI without and with contrast recommended to confirm a benign entity. 3. 2.9 x 1.8 cm heterogeneous right adrenal mass. Recommend biochemical lab evaluation for  pheochromocytoma Patient was empirically started on IV Zosyn, IV fentanyl was given.  Zofran was given.  Hospitalist was asked to admit patient for further evaluation and management.  Review of Systems: Review of systems as noted in the HPI. All other systems reviewed and are negative.   Past Medical History:  Diagnosis Date   Balance problem     High cholesterol    Hypertension    Low back pain    Palpitations    Rotator cuff tendinitis, left    Sciatica    Past Surgical History:  Procedure Laterality Date   ABDOMINAL HYSTERECTOMY     GLAUCOMA SURGERY Left 10/2017    Social History:  reports that she has never smoked. She has never used smokeless tobacco. She reports that she does not drink alcohol and does not use drugs.   No Known Allergies  History reviewed. No pertinent family history.    Prior to Admission medications   Medication Sig Start Date End Date Taking? Authorizing Provider  amLODipine (NORVASC) 5 MG tablet Take 1 tablet by mouth daily. 12/24/17   [provider]  esomeprazole (NEXIUM) 40 MG capsule Take 1 capsule by mouth daily. 02/24/18   [provider]  hydrochlorothiazide (MICROZIDE) 12.5 MG capsule Take 1 capsule by mouth every other day. 02/23/18   [provider]  lovastatin (MEVACOR) 20 MG tablet Take 20 mg by mouth at bedtime.    [provider]    Physical Exam: BP (!) 152/79   Pulse 80   Temp 98.2 F (36.8 C) (Oral)   Resp 18   Ht '5\' 6"'$  (1.676 m)   Wt 74.8 kg   SpO2 98%   BMI 26.62 kg/m   General: 81 y.o. year-old female ill appearing, but in no acute distress.  Alert and oriented x3. HEENT: NCAT, EOMI Neck: Supple, trachea medial Cardiovascular: Regular rate and rhythm with no rubs or gallops.  No thyromegaly or JVD noted.  No lower extremity edema.  2/4 pulses in all 4 extremities. Respiratory: Clear to auscultation with no wheezes or rales. Good inspiratory effort. Abdomen: Soft, tender to palpation without guarding.  Normal bowel sounds x4 quadrants. Muskuloskeletal: No cyanosis, clubbing or edema noted bilaterally Neuro: CN II-XII intact, sensation, reflexes intact Skin: No ulcerative lesions noted or rashes Psychiatry: Mood is appropriate for condition and setting          Labs on Admission:  Basic Metabolic Panel: Recent Labs  Lab  03/09/22 1759  NA 139  K 3.1*  CL 105  CO2 23  GLUCOSE 185*  BUN 12  CREATININE 0.86  CALCIUM 8.8*   Liver Function Tests: Recent Labs  Lab 03/09/22 1759  AST 14*  ALT 11  ALKPHOS 69  BILITOT 1.6*  PROT 7.6  ALBUMIN 4.6   Recent Labs  Lab 03/09/22 1759  LIPASE 21   No results for input(s): "AMMONIA" in the last 168 hours. CBC: Recent Labs  Lab 03/09/22 1759  WBC 17.4*  HGB 14.0  HCT 42.7  MCV 85.9  PLT 255   Cardiac Enzymes: No results for input(s): "CKTOTAL", "CKMB", "CKMBINDEX", "TROPONINI" in the last 168 hours.  BNP (last 3 results) No results for input(s): "BNP" in the last 8760 hours.  ProBNP (last 3 results) No results for input(s): "PROBNP" in the last 8760 hours.  CBG: No results for input(s): "GLUCAP" in the last 168 hours.  Radiological Exams on Admission: CT ABDOMEN PELVIS W CONTRAST  Result Date: 03/10/2022 CLINICAL DATA:  Generalized abdominal pain with nausea. EXAM: CT ABDOMEN AND PELVIS WITH CONTRAST TECHNIQUE: Multidetector CT imaging of the abdomen and pelvis was performed using the standard protocol following bolus administration of intravenous contrast. RADIATION DOSE REDUCTION: This exam was performed according to the departmental dose-optimization program which includes automated exposure control, adjustment of the mA and/or kV according to patient size and/or use of iterative reconstruction technique. CONTRAST:  189m OMNIPAQUE IOHEXOL 300 MG/ML  SOLN COMPARISON:  None Available. FINDINGS: Lower chest: Lung bases show posterior atelectasis but no infiltrates. There is mild cardiomegaly. No pericardial effusion. Small to moderate size hiatal hernia. Hepatobiliary: The liver is 20 cm length slightly steatotic. There is no mass enhancement. The gallbladder is distended up to 10 cm with tiny stones layering posteriorly and there is slight gallbladder wall thickening and pericholecystic edema concerning for acute cholecystitis. There is no  biliary dilatation. Pancreas: Unremarkable. Spleen: On the first series postcontrast there is a 9.1 x 7.8 x 9 cm heterogeneous mass in the superomedial aspect of the spleen. In the delayed images this is more uniform in attenuation suggesting this may represent a hemangioma. MRI without and with contrast is recommended for further evaluation. The spleen mildly enlarged overall at 14.7 cm in length without other focal abnormality. Adrenals/Urinary Tract: Heterogeneous right adrenal mass of 2.9 x 1.8 cm 91 Hounsfield units. The left adrenal gland are unremarkable. There is a 1.2 cm cyst in the superior pole of the right kidney of 10.2 Hounsfield units and a 1.1 cm cyst in the extreme inferior pole of this kidney 7.6 Hounsfield units. There are few tiny right renal cortical hypodensities which are too small to characterize. The left kidney is unremarkable. There is no calculus or urinary obstruction. The ureters insert low in the pelvis consistent with pelvic floor laxity but no cystocele is seen. The bladder is unremarkable. Stomach/Bowel: No dilatation or wall thickening. An appendix is not seen in this patient. Vascular/Lymphatic: There is moderately heavy aortoiliac calcific plaque. No AAA  or dissection. No critical stenosis. Reproductive: Status post hysterectomy. No adnexal masses. Other: Mild umbilical rectus diastasis and small umbilical fat hernia. There is no incarcerated hernia. There is no free air, free hemorrhage or free fluid. Musculoskeletal: There is levoscoliosis, degenerative change in osteopenia lumbar spine. Mild hip DJD. Incidentally noted 7.5 x 3.7 by 9.2 cm right gluteus minimis muscle lipoma. Severe acquired spinal stenosis L4-5 due to disc osteophyte complex, ligamentous and facet hypertrophy. IMPRESSION: 1. Cholelithiasis and findings concerning for acute cholecystitis. No biliary dilatation. 2. 9.1 x 7.8 x 9 cm heterogeneous splenic mass on the initial images, more homogeneous attenuation in  the delayed phase. 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 MRI without and with contrast recommended to confirm a benign entity. 3. 2.9 x 1.8 cm heterogeneous right adrenal mass. Recommend biochemical lab evaluation for pheochromocytoma. If lab values normal, recommend adrenal washout CT or chemical shift MR. JACR 2017 Aug; 14(8):1038-44, JCAT 2016 Mar-Apr; 40(2):194-200, Urol J 2006 Spring; 3(2):71-4. 4. Small right renal cysts and additional hypodensities which are too small to characterize. No follow-up imaging is recommended. JACR 2018 Feb; 264-273, Management of the Incidental RenalMass on CT, RadioGraphics 2021; 814-848, Bosniak Classification of Cystic Renal Masses, Version 2019. 5. Small/moderate size hiatal hernia. Umbilical rectus diastasis and small umbilical fat hernia. 6. Cardiomegaly, with aortic atherosclerosis. 7. Sizable right gluteus minimus lipoma. Osteopenia and degenerative change. 8. Pelvic floor laxity without evidence of cystocele 9. Severe acquired spinal stenosis L4-5. Electronically Signed   By: Telford Nab M.D.   On: 03/10/2022 00:41    EKG: I independently viewed the EKG done and my findings are as followed: EKG was not done in the ED  Assessment/Plan Present on Admission:  Abdominal pain  Principal Problem:   Abdominal pain Active Problems:   Nausea & vomiting   Hypokalemia   Hyperglycemia   Splenic mass   Right adrenal mass (HCC)   Essential hypertension   Mixed hyperlipidemia   GERD (gastroesophageal reflux disease)  Abdominal pain, nausea and vomiting possibly secondary to acute cholelithiasis, rule out acute cholecystitis Continue IV NS at 75 mLs/Hr Continue IV Zosyn q.8h Continue IV morphine 2 mg q.4h p.r.n. for moderate to severe pain Continue IV Zofran p.r.n. Patient will be kept n.p.o. at this time Ultrasound of the abdomen will be done in the morning to rule out acute cholecystitis General surgery was already consulted consulted by ED physician and will follow up  with patient in the morning  Leukocytosis in the setting of above WBC 17.4, continue treatment as described above  Hypokalemia K+ 3.1; this will be replenished  Hyperglycemia possibly reactive CBG 185, patient has no history of T2DM (this may be due to steroid effect; noted in patient's med rec) Continue to monitor blood glucose levels  Splenic mass 9.1 x 7.8 x 9 cm heterogeneous splenic mass suspected to be hemangioma was noted on CT abdomen and pelvis. MRI with and without contrast recommended  Right adrenal mass 2.9 x 1.8 cm heterogeneous right adrenal mass.  Plasma metanephrines will be checked as recommended by radiology  Essential hypertension Continue IV hydralazine 10 mg every 6 as needed for SBP > 170 Patient is currently n.p.o., consider restarting home meds after possible impending surgery  Mixed hyperlipidemia Patient is currently n.p.o., consider restarting home meds after possible impending surgery  GERD Continue Protonix  DVT prophylaxis: SCDs  Code Status: Full code  Consults: General surgery  Family Communication: Daughters at bedside (all questions answered to satisfaction)  Severity of Illness: The appropriate patient  status for this patient is INPATIENT. Inpatient status is judged to be reasonable and necessary in order to provide the required intensity of service to ensure the patient's safety. The patient's presenting symptoms, physical exam findings, and initial radiographic and laboratory data in the context of their chronic comorbidities is felt to place them at high risk for further clinical deterioration. Furthermore, it is not anticipated that the patient will be medically stable for discharge from the hospital within 2 midnights of admission.   * I certify that at the point of admission it is my clinical judgment that the patient will require inpatient hospital care spanning beyond 2 midnights from the point of admission due to high intensity of  service, high risk for further deterioration and high frequency of surveillance required.*  Author: Bernadette Hoit, DO 03/10/2022 5:22 AM  For on call review [email or web address].

## 2022-03-10 NOTE — Progress Notes (Signed)
Pharmacy Antibiotic Note  Abigail Frazier is a 81 y.o. female admitted on 03/09/2022 with  intra-abdominal infection .  Pharmacy has been consulted for Zosyn dosing. WBC elevated. Renal function good. Surgery to see later today.   Plan: Zosyn 3.375G IV q8h to be infused over 4 hours   Height: '5\' 6"'$  (167.6 cm) Weight: 74.8 kg (164 lb 14.5 oz) IBW/kg (Calculated) : 59.3  Temp (24hrs), Avg:98.1 F (36.7 C), Min:97.5 F (36.4 C), Max:98.6 F (37 C)  Recent Labs  Lab 03/09/22 1759  WBC 17.4*  CREATININE 0.86    Estimated Creatinine Clearance: 51.3 mL/min (by C-G formula based on SCr of 0.86 mg/dL).    No Known Allergies  Narda Bonds, PharmD, BCPS Clinical Pharmacist Phone: [phone]

## 2022-03-10 NOTE — ED Notes (Signed)
Due to repeat potassium of 3.8, Dr. Karleen Hampshire directed this writer to not given remaining 2 runs of potassium.

## 2022-03-10 NOTE — TOC Progression Note (Signed)
  Transition of Care [facility]) Screening Note   Patient Details  Name: Abigail Frazier Date of Birth: July 17, 1940   [facility] of Care [facility]) CM/SW Contact:    Boneta Lucks, RN Phone Number: 03/10/2022, 2:10 PM    Transition of Care Department [facility]) has reviewed patient and no TOC needs have been identified at this time. We will continue to monitor patient advancement through interdisciplinary progression rounds. If new patient transition needs arise, please place a TOC consult.       Barriers to Discharge: Continued Medical Work up

## 2022-03-10 NOTE — ED Provider Notes (Signed)
[facility] EMERGENCY DEPARTMENT Provider Note   CSN: [id number] Arrival date & time: 03/09/22  1735     History  Chief Complaint  Patient presents with   Abdominal Pain    Abigail Frazier is a 81 y.o. female.  Acute onset of abdominal pain around 1000 Saturday morning, progressively worsening throughout the day with NBNB emesis. No fevers. No h/o same. No BM or flatulence today.    Abdominal Pain      Home Medications Prior to Admission medications   Medication Sig Start Date End Date Taking? Authorizing Provider  amLODipine (NORVASC) 5 MG tablet Take 1 tablet by mouth daily. 12/24/17   [provider]  esomeprazole (NEXIUM) 40 MG capsule Take 1 capsule by mouth daily. 02/24/18   [provider]  hydrochlorothiazide (MICROZIDE) 12.5 MG capsule Take 1 capsule by mouth every other day. 02/23/18   [provider]  lovastatin (MEVACOR) 20 MG tablet Take 20 mg by mouth at bedtime.    [provider]      Allergies    Patient has no known allergies.    Review of Systems   Review of Systems  Gastrointestinal:  Positive for abdominal pain.    Physical Exam Updated Vital Signs BP (!) 151/80   Pulse 73   Temp 98.2 F (36.8 C) (Oral)   Resp (!) 26   Ht '5\' 6"'$  (1.676 m)   Wt 74.8 kg   SpO2 95%   BMI 26.62 kg/m  Physical Exam Vitals and nursing note reviewed.  Constitutional:      Appearance: She is well-developed.  HENT:     Head: Normocephalic and atraumatic.  Cardiovascular:     Rate and Rhythm: Normal rate and regular rhythm.  Pulmonary:     Effort: No respiratory distress.     Breath sounds: No stridor.  Abdominal:     General: There is no distension.     Tenderness: There is abdominal tenderness in the right upper quadrant and epigastric area.  Musculoskeletal:     Cervical back: Normal range of motion.  Neurological:     Mental Status: She is alert.     ED Results / Procedures / Treatments   Labs (all labs  ordered are listed, but only abnormal results are displayed) Labs Reviewed  COMPREHENSIVE METABOLIC PANEL - Abnormal; Notable for the following components:      Result Value   Potassium 3.1 (*)    Glucose, Bld 185 (*)    Calcium 8.8 (*)    AST 14 (*)    Total Bilirubin 1.6 (*)    All other components within normal limits  CBC - Abnormal; Notable for the following components:   WBC 17.4 (*)    RDW 15.9 (*)    All other components within normal limits  URINALYSIS, ROUTINE W REFLEX MICROSCOPIC - Abnormal; Notable for the following components:   Specific Gravity, Urine 1.031 (*)    Hgb urine dipstick SMALL (*)    Protein, ur 30 (*)    Bacteria, UA RARE (*)    All other components within normal limits  LIPASE, BLOOD    EKG None  Radiology CT ABDOMEN PELVIS W CONTRAST  Result Date: 03/10/2022 CLINICAL DATA:  Generalized abdominal pain with nausea. EXAM: CT ABDOMEN AND PELVIS WITH CONTRAST TECHNIQUE: Multidetector CT imaging of the abdomen and pelvis was performed using the standard protocol following bolus administration of intravenous contrast. RADIATION DOSE REDUCTION: This exam was performed according to the departmental dose-optimization program which  includes automated exposure control, adjustment of the mA and/or kV according to patient size and/or use of iterative reconstruction technique. CONTRAST:  167m OMNIPAQUE IOHEXOL 300 MG/ML  SOLN COMPARISON:  None Available. FINDINGS: Lower chest: Lung bases show posterior atelectasis but no infiltrates. There is mild cardiomegaly. No pericardial effusion. Small to moderate size hiatal hernia. Hepatobiliary: The liver is 20 cm length slightly steatotic. There is no mass enhancement. The gallbladder is distended up to 10 cm with tiny stones layering posteriorly and there is slight gallbladder wall thickening and pericholecystic edema concerning for acute cholecystitis. There is no biliary dilatation. Pancreas: Unremarkable. Spleen: On the first  series postcontrast there is a 9.1 x 7.8 x 9 cm heterogeneous mass in the superomedial aspect of the spleen. In the delayed images this is more uniform in attenuation suggesting this may represent a hemangioma. MRI without and with contrast is recommended for further evaluation. The spleen mildly enlarged overall at 14.7 cm in length without other focal abnormality. Adrenals/Urinary Tract: Heterogeneous right adrenal mass of 2.9 x 1.8 cm 91 Hounsfield units. The left adrenal gland are unremarkable. There is a 1.2 cm cyst in the superior pole of the right kidney of 10.2 Hounsfield units and a 1.1 cm cyst in the extreme inferior pole of this kidney 7.6 Hounsfield units. There are few tiny right renal cortical hypodensities which are too small to characterize. The left kidney is unremarkable. There is no calculus or urinary obstruction. The ureters insert low in the pelvis consistent with pelvic floor laxity but no cystocele is seen. The bladder is unremarkable. Stomach/Bowel: No dilatation or wall thickening. An appendix is not seen in this patient. Vascular/Lymphatic: There is moderately heavy aortoiliac calcific plaque. No AAA or dissection. No critical stenosis. Reproductive: Status post hysterectomy. No adnexal masses. Other: Mild umbilical rectus diastasis and small umbilical fat hernia. There is no incarcerated hernia. There is no free air, free hemorrhage or free fluid. Musculoskeletal: There is levoscoliosis, degenerative change in osteopenia lumbar spine. Mild hip DJD. Incidentally noted 7.5 x 3.7 by 9.2 cm right gluteus minimis muscle lipoma. Severe acquired spinal stenosis L4-5 due to disc osteophyte complex, ligamentous and facet hypertrophy. IMPRESSION: 1. Cholelithiasis and findings concerning for acute cholecystitis. No biliary dilatation. 2. 9.1 x 7.8 x 9 cm heterogeneous splenic mass on the initial images, more homogeneous attenuation in the delayed phase. 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 MRI without and with  contrast recommended to confirm a benign entity. 3. 2.9 x 1.8 cm heterogeneous right adrenal mass. Recommend biochemical lab evaluation for pheochromocytoma. If lab values normal, recommend adrenal washout CT or chemical shift MR. JACR 2017 Aug; 14(8):1038-44, JCAT 2016 Mar-Apr; 40(2):194-200, Urol J 2006 Spring; 3(2):71-4. 4. Small right renal cysts and additional hypodensities which are too small to characterize. No follow-up imaging is recommended. JACR 2018 Feb; 264-273, Management of the Incidental RenalMass on CT, [employer] 2021; 814-848, Bosniak Classification of Cystic Renal Masses, Version 2019. 5. Small/moderate size hiatal hernia. Umbilical rectus diastasis and small umbilical fat hernia. 6. Cardiomegaly, with aortic atherosclerosis. 7. Sizable right gluteus minimus lipoma. Osteopenia and degenerative change. 8. Pelvic floor laxity without evidence of cystocele 9. Severe acquired spinal stenosis L4-5. Electronically Signed   By: KTelford NabM.D.   On: 03/10/2022 00:41    Procedures Procedures    Medications Ordered in ED Medications  lactated ringers infusion ( Intravenous Restarted 03/10/22 0256)  fentaNYL (SUBLIMAZE) injection 50 mcg (50 mcg Intravenous Given 03/10/22 0124)  ondansetron (ZOFRAN) injection 4 mg (4 mg Intravenous Given  03/10/22 0120)  iohexol (OMNIPAQUE) 300 MG/ML solution 100 mL (100 mLs Intravenous Contrast Given 03/10/22 0006)  piperacillin-tazobactam (ZOSYN) IVPB 3.375 g (0 g Intravenous Stopped 03/10/22 0256)    ED Course/ Medical Decision Making/ A&P                           Medical Decision Making Amount and/or Complexity of Data Reviewed Labs: ordered. Radiology: ordered.  Risk Prescription drug management. Decision regarding hospitalization.   CT AP done and showed cholelithiasis with some pericholecystic fluid and large splenic mass (independently viewed and interpreted by myself and radiology read reviewed).  D/w Dr. Melynda Ripple and recommends  abx, pain control, [place] in AM and admit to hospitalist. D/w Dr. Josephine Cables for admission.    Final Clinical Impression(s) / ED Diagnoses Final diagnoses:  Right upper quadrant abdominal pain  Calculus of gallbladder with acute cholecystitis without obstruction    Rx / DC Orders ED Discharge Orders     None         Hadiyah Maricle, Corene Cornea, MD 03/10/22 [id number]

## 2022-03-10 NOTE — ED Notes (Signed)
US at bedside

## 2022-03-11 ENCOUNTER — Inpatient Hospital Stay (HOSPITAL_COMMUNITY): Payer: Medicare HMO

## 2022-03-11 DIAGNOSIS — I10 Essential (primary) hypertension: Secondary | ICD-10-CM | POA: Diagnosis not present

## 2022-03-11 DIAGNOSIS — R1033 Periumbilical pain: Secondary | ICD-10-CM | POA: Diagnosis not present

## 2022-03-11 DIAGNOSIS — K219 Gastro-esophageal reflux disease without esophagitis: Secondary | ICD-10-CM | POA: Diagnosis not present

## 2022-03-11 DIAGNOSIS — E876 Hypokalemia: Secondary | ICD-10-CM | POA: Diagnosis not present

## 2022-03-11 LAB — CBC
HCT: 37.2 % (ref 36.0–46.0)
Hemoglobin: 12.5 g/dL (ref 12.0–15.0)
MCH: 28.5 pg (ref 26.0–34.0)
MCHC: 33.6 g/dL (ref 30.0–36.0)
MCV: 84.7 fL (ref 80.0–100.0)
Platelets: 218 10*3/uL (ref 150–400)
RBC: 4.39 MIL/uL (ref 3.87–5.11)
RDW: 15.9 % — ABNORMAL HIGH (ref 11.5–15.5)
WBC: 17.8 10*3/uL — ABNORMAL HIGH (ref 4.0–10.5)
nRBC: 0 % (ref 0.0–0.2)

## 2022-03-11 LAB — COMPREHENSIVE METABOLIC PANEL
ALT: 14 U/L (ref 0–44)
AST: 20 U/L (ref 15–41)
Albumin: 3.4 g/dL — ABNORMAL LOW (ref 3.5–5.0)
Alkaline Phosphatase: 62 U/L (ref 38–126)
Anion gap: 8 (ref 5–15)
BUN: 12 mg/dL (ref 8–23)
CO2: 27 mmol/L (ref 22–32)
Calcium: 8.3 mg/dL — ABNORMAL LOW (ref 8.9–10.3)
Chloride: 102 mmol/L (ref 98–111)
Creatinine, Ser: 0.93 mg/dL (ref 0.44–1.00)
GFR, Estimated: >60 mL/min (ref >60)
Glucose, Bld: 103 mg/dL — ABNORMAL HIGH (ref 70–99)
Potassium: 3.3 mmol/L — ABNORMAL LOW (ref 3.5–5.1)
Sodium: 137 mmol/L (ref 135–145)
Total Bilirubin: 3.9 mg/dL — ABNORMAL HIGH (ref 0.3–1.2)
Total Protein: 6.6 g/dL (ref 6.5–8.1)

## 2022-03-11 MED ORDER — GADOBUTROL 1 MMOL/ML IV SOLN
7.0000 mL | Freq: Once | INTRAVENOUS | Status: AC | PRN
Start: 2022-03-11 — End: 2022-03-11
  Administered 2022-03-11: 7 mL via INTRAVENOUS

## 2022-03-11 MED ORDER — TECHNETIUM TC 99M MEBROFENIN IV KIT
5.0000 | PACK | Freq: Once | INTRAVENOUS | Status: AC | PRN
  Administered 2022-03-11: 5.5 via INTRAVENOUS

## 2022-03-11 MED ORDER — MORPHINE SULFATE (PF) 4 MG/ML IV SOLN
3.0000 mg | Freq: Once | INTRAVENOUS | Status: AC
  Administered 2022-03-11: 3 mg via INTRAVENOUS
  Filled 2022-03-11: qty 1

## 2022-03-11 MED ORDER — POTASSIUM CHLORIDE CRYS ER 20 MEQ PO TBCR
40.0000 meq | EXTENDED_RELEASE_TABLET | Freq: Once | ORAL | Status: AC
  Administered 2022-03-11: 40 meq via ORAL
  Filled 2022-03-11: qty 2

## 2022-03-11 NOTE — Progress Notes (Signed)
PROGRESS NOTE       PROGRESS NOTE    Abigail Frazier  URK:[id number] DOB: 1941-01-23 DOA: 03/09/2022 PCP: Iona Beard, MD    Chief Complaint  Patient presents with   Abdominal Pain    Brief Narrative:   Abigail Frazier is a 81 y.o. female with medical history significant of essential hypertension, hyperlipidemia, GERD who presents to the emergency department due to abdominal pain which started yesterday in the evening while watching TV.  Abdominal pain was in the periumbilical area and was associated with nausea and vomiting.     CT abdomen and pelvis with contrast showed: 1.  Cholelithiasis and findings concerning for acute cholecystitis.  No biliary dilatation 2. 9.1 x 7.8 x 9 cm heterogeneous splenic mass on the initial images, more homogeneous attenuation in the delayed phase. 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 MRI without and with contrast recommended to confirm a benign entity. 3. 2.9 x 1.8 cm heterogeneous right adrenal mass. Recommend biochemical lab evaluation for  pheochromocytoma Patient was empirically started on IV Zosyn, IV fentanyl was given.  Zofran was given.  Hospitalist was asked to admit patient for further evaluation and management.     Surgery consulted,  Plan for HIDA SCAN and MRI abdomen today.   Assessment & Plan:   Principal Problem:   Abdominal pain Active Problems:   Nausea & vomiting   Hypokalemia   Hyperglycemia   Splenic mass   Right adrenal mass (HCC)   Essential hypertension   Mixed hyperlipidemia   GERD (gastroesophageal reflux disease)   Nausea, vomiting and abdominal pain:  Abdominal imaging showing acute cholecystitis.  MRI of the abdomen showing Cholelithiasis with pericholecystic edema, again concerning fo acute cholecystitis. No evidence of choledocholithiasis or biliary ductal dilatation. HIDA scan is positive for acute cholecystitis.  General surgery onboard.  On IV antibiotics, and on IV zofran and pain control.      Leukocytosis  Improving.    Hypokalemia: replaced.    SPLENIC MASS.: Rounded, faintly circumscribed mass occupying essentially the entirety of the superior spleen, measuring 10.0 x 8.5 x 9.7 cm, with heterogeneous hypoenhancement to splenic parenchyma on multiphasic contrast enhanced sequences. 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 Malignancy or metastatic disease can not however be excluded on the basis of imaging. Consider short interval follow-up in 3-6 months to ensure stability.    Definitively benign,right adrenal adenoma, : No further follow up needed.  Plasma metanephrines are pending.    Hypertension:  Well controlled.     GERD stable.      DVT prophylaxis: (SCd's) Code Status: Full code.  Family Communication: FAMILY MEMBERS AT BEDSIDE.  Disposition:   Status is: Inpatient Remains inpatient appropriate because:    Level of care: Med-Surg Consultants:  GENERAL SURGERY.   Procedures: MRI of the abdomen.  HIDA SCAN.   Antimicrobials: none.    Subjective: No nausea, or vomiting.   Objective: Vitals:   03/11/22 0232 03/11/22 0645 03/11/22 1235 03/11/22 1252  BP: 136/74 122/73 105/64 102/66  Pulse: 81 80 84 87  Resp: '20 15 18 18  '$ Temp:  98.5 F (36.9 C)  98.4 F (36.9 C)  TempSrc:   Oral   SpO2: 96% 96% 97% 96%  Weight:      Height:        Intake/Output Summary (Last 24 hours) at 03/11/2022 1448 Last data filed at 03/11/2022 0129 Gross per 24 hour  Intake 89.31 ml  Output 800  ml  Net -710.69 ml   Filed Weights   03/09/22 1742  Weight: 74.8 kg    Examination:  General exam: Appears calm and comfortable  Respiratory system: Clear to auscultation. Respiratory effort normal. Cardiovascular system: S1 & S2 heard, RRR. No JVD, murmurs, rubs, gallops or clicks. No pedal edema. Gastrointestinal system: Abdomen is nondistended, soft and  nontender. No organomegaly or masses felt. Normal bowel sounds heard. Central nervous system: Alert and oriented. No focal neurological deficits. Extremities: Symmetric 5 x 5 power. Skin: No rashes, lesions or ulcers Psychiatry: Judgement and insight appear normal. Mood & affect appropriate.     Data Reviewed: I have personally reviewed following labs and imaging studies  CBC: Recent Labs  Lab 03/09/22 1759 03/10/22 0542 03/11/22 0909  WBC 17.4* 20.3* 17.8*  HGB 14.0 13.9 12.5  HCT 42.7 41.0 37.2  MCV 85.9 85.4 84.7  PLT 255 232 222    Basic Metabolic Panel: Recent Labs  Lab 03/09/22 1759 03/10/22 0542 03/11/22 0909  NA 139 137 137  K 3.1* 3.8 3.3*  CL 105 102 102  CO2 '23 27 27  '$ GLUCOSE 185* 125* 103*  BUN '12 9 12  '$ CREATININE 0.86 0.64 0.93  CALCIUM 8.8* 8.9 8.3*  MG  --  1.8  --   PHOS  --  2.3*  --     GFR: Estimated Creatinine Clearance: 47.4 mL/min (by C-G formula based on SCr of 0.93 mg/dL).  Liver Function Tests: Recent Labs  Lab 03/09/22 1759 03/10/22 0542 03/11/22 0909  AST 14* 15 20  ALT '11 11 14  '$ ALKPHOS 69 71 62  BILITOT 1.6* 2.0* 3.9*  PROT 7.6 7.5 6.6  ALBUMIN 4.6 4.3 3.4*    CBG: No results for input(s): "GLUCAP" in the last 168 hours.   No results found for this or any previous visit (from the past 240 hour(s)).       Radiology Studies: NM Hepato W/EF  Result Date: 03/11/2022 CLINICAL DATA:  Abdominal pain, cholelithiasis, question acute cholecystitis EXAM: NUCLEAR MEDICINE HEPATOBILIARY IMAGING TECHNIQUE: Sequential images of the abdomen were obtained out to 60 minutes following intravenous administration of radiopharmaceutical. RADIOPHARMACEUTICALS:  6.7 mCi Tc-60mCholetec IV (5.5 mCi + 1.2 mCi) COMPARISON:  CT abdomen and pelvis and ultrasound abdomen 03/10/2022 FINDINGS: Normal tracer extraction from bloodstream indicating normal hepatocellular function. Prompt excretion of tracer into biliary tree. Small bowel visualized by  19 minutes. At 1 hour, gallbladder had not visualized. Patient was reinjected with additional tracer and 3 mg of morphine IV. Imaging for 30 minutes fails to demonstrate tracer within the gallbladder. Findings are consistent with cystic duct obstruction and acute cholecystitis. IMPRESSION: Nonvisualization of the gallbladder despite morphine augmentation consistent with acute cholecystitis. Patent CBD. Electronically Signed   By: MLavonia DanaM.D.   On: 03/11/2022 13:48   MR ABDOMEN W WO CONTRAST  Result Date: 03/11/2022 CLINICAL DATA:  Splenic mass identified by CT EXAM: MRI ABDOMEN WITHOUT AND WITH CONTRAST TECHNIQUE: Multiplanar multisequence MR imaging of the abdomen was performed both before and after the administration of intravenous contrast. CONTRAST:  721mGADAVIST GADOBUTROL 1 MMOL/ML IV SOLN COMPARISON:  CT abdomen pelvis, 03/10/2022 FINDINGS: Lower chest: No acute abnormality. Hepatobiliary: No solid liver abnormality is seen. Numerous tiny gallstones in the dependent gallbladder (series 4, image 24). Similar pericholecystic edema (series 3, image 16). No biliary ductal dilatation. No gallstones identified to the ampulla. Pancreas: Unremarkable. No pancreatic ductal dilatation or surrounding inflammatory changes. Spleen: Spleen is enlarged by the presence  of a mass, maximum coronal span 15.5 cm. Rounded, faintly circumscribed mass occupying essentially the entirety of the superior spleen is nearly isosignal to splenic parenchyma, minimally T2 hyperintense, with heterogeneous hypoenhancement to splenic parenchyma on multiphasic contrast enhanced sequences. This lesion measures 10.0 x 8.5 x 9.7 cm (series 4, image 16, series 3, image 22). Adrenals/Urinary Tract: Definitively benign, macroscopic fat containing right adrenal adenoma, for which no further follow-up or characterization is required (series 12, image 25). Normal left adrenal gland. Kidneys are normal, without renal calculi, solid lesion, or  hydronephrosis. Stomach/Bowel: Stomach is within normal limits. No evidence of bowel wall thickening, distention, or inflammatory changes. Vascular/Lymphatic: No significant vascular findings are present. No enlarged abdominal lymph nodes. Other: No abdominal wall hernia or abnormality. No ascites. Musculoskeletal: No acute or significant osseous findings. IMPRESSION: 1. Cholelithiasis with pericholecystic edema, again concerning for acute cholecystitis. No evidence of choledocholithiasis or biliary ductal dilatation. 2. Rounded, faintly circumscribed mass occupying essentially the entirety of the superior spleen, measuring 10.0 x 8.5 x 9.7 cm, with heterogeneous hypoenhancement to splenic parenchyma on multiphasic contrast enhanced sequences. 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 Malignancy or metastatic disease can not however be excluded on the basis of imaging. Consider short interval follow-up in 3-6 months to ensure stability. 3. Definitively benign, macroscopic fat containing right adrenal adenoma, for which no further follow-up or characterization is required. Electronically Signed   By: Delanna Ahmadi M.D.   On: 03/11/2022 12:05   MR 3D Recon At Scanner  Result Date: 03/11/2022 CLINICAL DATA:  Splenic mass identified by CT EXAM: MRI ABDOMEN WITHOUT AND WITH CONTRAST TECHNIQUE: Multiplanar multisequence MR imaging of the abdomen was performed both before and after the administration of intravenous contrast. CONTRAST:  2m GADAVIST GADOBUTROL 1 MMOL/ML IV SOLN COMPARISON:  CT abdomen pelvis, 03/10/2022 FINDINGS: Lower chest: No acute abnormality. Hepatobiliary: No solid liver abnormality is seen. Numerous tiny gallstones in the dependent gallbladder (series 4, image 24). Similar pericholecystic edema (series 3, image 16). No biliary ductal dilatation. No gallstones identified to the ampulla. Pancreas:  Unremarkable. No pancreatic ductal dilatation or surrounding inflammatory changes. Spleen: Spleen is enlarged by the presence of a mass, maximum coronal span 15.5 cm. Rounded, faintly circumscribed mass occupying essentially the entirety of the superior spleen is nearly isosignal to splenic parenchyma, minimally T2 hyperintense, with heterogeneous hypoenhancement to splenic parenchyma on multiphasic contrast enhanced sequences. This lesion measures 10.0 x 8.5 x 9.7 cm (series 4, image 16, series 3, image 22). Adrenals/Urinary Tract: Definitively benign, macroscopic fat containing right adrenal adenoma, for which no further follow-up or characterization is required (series 12, image 25). Normal left adrenal gland. Kidneys are normal, without renal calculi, solid lesion, or hydronephrosis. Stomach/Bowel: Stomach is within normal limits. No evidence of bowel wall thickening, distention, or inflammatory changes. Vascular/Lymphatic: No significant vascular findings are present. No enlarged abdominal lymph nodes. Other: No abdominal wall hernia or abnormality. No ascites. Musculoskeletal: No acute or significant osseous findings. IMPRESSION: 1. Cholelithiasis with pericholecystic edema, again concerning for acute cholecystitis. No evidence of choledocholithiasis or biliary ductal dilatation. 2. Rounded, faintly circumscribed mass occupying essentially the entirety of the superior spleen, measuring 10.0 x 8.5 x 9.7 cm, with heterogeneous hypoenhancement to splenic parenchyma on multiphasic contrast enhanced sequences. 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 Malignancy or metastatic disease can not however be excluded on  the basis of imaging. Consider short interval follow-up in 3-6 months to ensure stability. 3. Definitively benign, macroscopic fat containing right adrenal adenoma, for which no further follow-up or characterization  is required. Electronically Signed   By: Delanna Ahmadi M.D.   On: 03/11/2022 12:05   US Abdomen Limited  Result Date: 03/10/2022 CLINICAL DATA:  Mid abdominal pain. Findings concerning for acute cholecystitis on CT imaging. EXAM: ULTRASOUND ABDOMEN LIMITED RIGHT UPPER QUADRANT COMPARISON:  CT of the abdomen and pelvis March 10, 2022 FINDINGS: Gallbladder: Numerous tiny stones in the gallbladder without wall thickening, pericholecystic fluid, or Murphy's sign. Common bile duct: Diameter: 3.5 mm Liver: No focal lesion identified. Within normal limits in parenchymal echogenicity. Portal vein is patent on color Doppler imaging with normal direction of blood flow towards the liver. Other: None. IMPRESSION: 1. Cholelithiasis without wall thickening, pericholecystic fluid, or Murphy's sign. On the CT scan from earlier today, there is distension of the gallbladder and mild increased attenuation in the pericholecystic fat. As result, the findings on the ultrasound do not demonstrate evidence of acute cholecystitis but the findings on CT imaging are concerning for acute cholecystitis. Recommend clinical correlation. If the clinical picture remains ambiguous, recommend a HIDA scan for further evaluation. 2. No other abnormalities. Electronically Signed   By: Dorise Bullion III M.D.   On: 03/10/2022 10:24   CT ABDOMEN PELVIS W CONTRAST  Result Date: 03/10/2022 CLINICAL DATA:  Generalized abdominal pain with nausea. EXAM: CT ABDOMEN AND PELVIS WITH CONTRAST TECHNIQUE: Multidetector CT imaging of the abdomen and pelvis was performed using the standard protocol following bolus administration of intravenous contrast. RADIATION DOSE REDUCTION: This exam was performed according to the departmental dose-optimization program which includes automated exposure control, adjustment of the mA and/or kV according to patient size and/or use of iterative reconstruction technique. CONTRAST:  186m OMNIPAQUE IOHEXOL 300 MG/ML  SOLN  COMPARISON:  None Available. FINDINGS: Lower chest: Lung bases show posterior atelectasis but no infiltrates. There is mild cardiomegaly. No pericardial effusion. Small to moderate size hiatal hernia. Hepatobiliary: The liver is 20 cm length slightly steatotic. There is no mass enhancement. The gallbladder is distended up to 10 cm with tiny stones layering posteriorly and there is slight gallbladder wall thickening and pericholecystic edema concerning for acute cholecystitis. There is no biliary dilatation. Pancreas: Unremarkable. Spleen: On the first series postcontrast there is a 9.1 x 7.8 x 9 cm heterogeneous mass in the superomedial aspect of the spleen. In the delayed images this is more uniform in attenuation suggesting this may represent a hemangioma. MRI without and with contrast is recommended for further evaluation. The spleen mildly enlarged overall at 14.7 cm in length without other focal abnormality. Adrenals/Urinary Tract: Heterogeneous right adrenal mass of 2.9 x 1.8 cm 91 Hounsfield units. The left adrenal gland are unremarkable. There is a 1.2 cm cyst in the superior pole of the right kidney of 10.2 Hounsfield units and a 1.1 cm cyst in the extreme inferior pole of this kidney 7.6 Hounsfield units. There are few tiny right renal cortical hypodensities which are too small to characterize. The left kidney is unremarkable. There is no calculus or urinary obstruction. The ureters insert low in the pelvis consistent with pelvic floor laxity but no cystocele is seen. The bladder is unremarkable. Stomach/Bowel: No dilatation or wall thickening. An appendix is not seen in this patient. Vascular/Lymphatic: There is moderately heavy aortoiliac calcific plaque. No AAA or dissection. No critical stenosis. Reproductive: Status post hysterectomy. No adnexal masses.  Other: Mild umbilical rectus diastasis and small umbilical fat hernia. There is no incarcerated hernia. There is no free air, free hemorrhage or free  fluid. Musculoskeletal: There is levoscoliosis, degenerative change in osteopenia lumbar spine. Mild hip DJD. Incidentally noted 7.5 x 3.7 by 9.2 cm right gluteus minimis muscle lipoma. Severe acquired spinal stenosis L4-5 due to disc osteophyte complex, ligamentous and facet hypertrophy. IMPRESSION: 1. Cholelithiasis and findings concerning for acute cholecystitis. No biliary dilatation. 2. 9.1 x 7.8 x 9 cm heterogeneous splenic mass on the initial images, more homogeneous attenuation in the delayed phase. 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 MRI without and with contrast recommended to confirm a benign entity. 3. 2.9 x 1.8 cm heterogeneous right adrenal mass. Recommend biochemical lab evaluation for pheochromocytoma. If lab values normal, recommend adrenal washout CT or chemical shift MR. JACR 2017 Aug; 14(8):1038-44, JCAT 2016 Mar-Apr; 40(2):194-200, Urol J 2006 Spring; 3(2):71-4. 4. Small right renal cysts and additional hypodensities which are too small to characterize. No follow-up imaging is recommended. JACR 2018 Feb; 264-273, Management of the Incidental RenalMass on CT, [employer] 2021; 814-848, Bosniak Classification of Cystic Renal Masses, Version 2019. 5. Small/moderate size hiatal hernia. Umbilical rectus diastasis and small umbilical fat hernia. 6. Cardiomegaly, with aortic atherosclerosis. 7. Sizable right gluteus minimus lipoma. Osteopenia and degenerative change. 8. Pelvic floor laxity without evidence of cystocele 9. Severe acquired spinal stenosis L4-5. Electronically Signed   By: Telford Nab M.D.   On: 03/10/2022 00:41        Scheduled Meds:  pantoprazole (PROTONIX) IV  40 mg Intravenous Q24H   Continuous Infusions:  lactated ringers Stopped (03/10/22 2125)   piperacillin-tazobactam (ZOSYN)  IV 3.375 g (03/11/22 1406)     LOS: 1 day    Time spent: 35 minutes.     Hosie Poisson, MD [facility]

## 2022-03-11 NOTE — Progress Notes (Signed)
[facility] Progress Note     Subjective: Patient seen and examined.  She is resting comfortably in bed.  She was able to tolerate the clear liquids yesterday without nausea and vomiting.  She denies abdominal pain at this time.  Denies fevers and chills.  Objective: Vital signs in last 24 hours: Temp:  [98.1 F (36.7 C)-99.1 F (37.3 C)] 98.5 F (36.9 C) (08/28 0645) Pulse Rate:  [68-81] 80 (08/28 0645) Resp:  [15-32] 15 (08/28 0645) BP: (118-154)/(65-107) 122/73 (08/28 0645) SpO2:  [93 %-100 %] 96 % (08/28 0645)    Intake/Output from previous day: 08/27 0701 - 08/28 0700 In: 89.3 [IV Piggyback:89.3] Out: 2550 [Urine:2550] Intake/Output this shift: No intake/output data recorded.  General appearance: alert, cooperative, and no distress GI: Abdomen soft, nondistended, no percussion tenderness, nontender to palpation; no rigidity, guarding, rebound tenderness; negative Murphy sign  Lab Results:  Recent Labs    03/10/22 0542 03/11/22 0909  WBC 20.3* 17.8*  HGB 13.9 12.5  HCT 41.0 37.2  PLT 232 218   BMET Recent Labs    03/10/22 0542 03/11/22 0909  NA 137 137  K 3.8 3.3*  CL 102 102  CO2 27 27  GLUCOSE 125* 103*  BUN 9 12  CREATININE 0.64 0.93  CALCIUM 8.9 8.3*   PT/INR No results for input(s): "LABPROT", "INR" in the last 72 hours.  Studies/Results: US Abdomen Limited  Result Date: 03/10/2022 CLINICAL DATA:  Mid abdominal pain. Findings concerning for acute cholecystitis on CT imaging. EXAM: ULTRASOUND ABDOMEN LIMITED RIGHT UPPER QUADRANT COMPARISON:  CT of the abdomen and pelvis March 10, 2022 FINDINGS: Gallbladder: Numerous tiny stones in the gallbladder without wall thickening, pericholecystic fluid, or Murphy's sign. Common bile duct: Diameter: 3.5 mm Liver: No focal lesion identified. Within normal limits in parenchymal echogenicity. Portal vein is patent on color Doppler imaging with normal direction of blood flow towards the liver.  Other: None. IMPRESSION: 1. Cholelithiasis without wall thickening, pericholecystic fluid, or Murphy's sign. On the CT scan from earlier today, there is distension of the gallbladder and mild increased attenuation in the pericholecystic fat. As result, the findings on the ultrasound do not demonstrate evidence of acute cholecystitis but the findings on CT imaging are concerning for acute cholecystitis. Recommend clinical correlation. If the clinical picture remains ambiguous, recommend a HIDA scan for further evaluation. 2. No other abnormalities. Electronically Signed   By: Dorise Bullion III M.D.   On: 03/10/2022 10:24   CT ABDOMEN PELVIS W CONTRAST  Result Date: 03/10/2022 CLINICAL DATA:  Generalized abdominal pain with nausea. EXAM: CT ABDOMEN AND PELVIS WITH CONTRAST TECHNIQUE: Multidetector CT imaging of the abdomen and pelvis was performed using the standard protocol following bolus administration of intravenous contrast. RADIATION DOSE REDUCTION: This exam was performed according to the departmental dose-optimization program which includes automated exposure control, adjustment of the mA and/or kV according to patient size and/or use of iterative reconstruction technique. CONTRAST:  16m OMNIPAQUE IOHEXOL 300 MG/ML  SOLN COMPARISON:  None Available. FINDINGS: Lower chest: Lung bases show posterior atelectasis but no infiltrates. There is mild cardiomegaly. No pericardial effusion. Small to moderate size hiatal hernia. Hepatobiliary: The liver is 20 cm length slightly steatotic. There is no mass enhancement. The gallbladder is distended up to 10 cm with tiny stones layering posteriorly and there is slight gallbladder wall thickening and pericholecystic edema concerning for acute cholecystitis. There is no biliary dilatation. Pancreas: Unremarkable. Spleen: On the first series postcontrast there is a 9.1 x  7.8 x 9 cm heterogeneous mass in the superomedial aspect of the spleen. In the delayed images this  is more uniform in attenuation suggesting this may represent a hemangioma. MRI without and with contrast is recommended for further evaluation. The spleen mildly enlarged overall at 14.7 cm in length without other focal abnormality. Adrenals/Urinary Tract: Heterogeneous right adrenal mass of 2.9 x 1.8 cm 91 Hounsfield units. The left adrenal gland are unremarkable. There is a 1.2 cm cyst in the superior pole of the right kidney of 10.2 Hounsfield units and a 1.1 cm cyst in the extreme inferior pole of this kidney 7.6 Hounsfield units. There are few tiny right renal cortical hypodensities which are too small to characterize. The left kidney is unremarkable. There is no calculus or urinary obstruction. The ureters insert low in the pelvis consistent with pelvic floor laxity but no cystocele is seen. The bladder is unremarkable. Stomach/Bowel: No dilatation or wall thickening. An appendix is not seen in this patient. Vascular/Lymphatic: There is moderately heavy aortoiliac calcific plaque. No AAA or dissection. No critical stenosis. Reproductive: Status post hysterectomy. No adnexal masses. Other: Mild umbilical rectus diastasis and small umbilical fat hernia. There is no incarcerated hernia. There is no free air, free hemorrhage or free fluid. Musculoskeletal: There is levoscoliosis, degenerative change in osteopenia lumbar spine. Mild hip DJD. Incidentally noted 7.5 x 3.7 by 9.2 cm right gluteus minimis muscle lipoma. Severe acquired spinal stenosis L4-5 due to disc osteophyte complex, ligamentous and facet hypertrophy. IMPRESSION: 1. Cholelithiasis and findings concerning for acute cholecystitis. No biliary dilatation. 2. 9.1 x 7.8 x 9 cm heterogeneous splenic mass on the initial images, more homogeneous attenuation in the delayed phase. 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 MRI without and with contrast recommended to confirm a benign entity. 3. 2.9 x 1.8 cm heterogeneous right adrenal mass. Recommend biochemical lab evaluation for  pheochromocytoma. If lab values normal, recommend adrenal washout CT or chemical shift MR. JACR 2017 Aug; 14(8):1038-44, JCAT 2016 Mar-Apr; 40(2):194-200, Urol J 2006 Spring; 3(2):71-4. 4. Small right renal cysts and additional hypodensities which are too small to characterize. No follow-up imaging is recommended. JACR 2018 Feb; 264-273, Management of the Incidental RenalMass on CT, [employer] 2021; 814-848, Bosniak Classification of Cystic Renal Masses, Version 2019. 5. Small/moderate size hiatal hernia. Umbilical rectus diastasis and small umbilical fat hernia. 6. Cardiomegaly, with aortic atherosclerosis. 7. Sizable right gluteus minimus lipoma. Osteopenia and degenerative change. 8. Pelvic floor laxity without evidence of cystocele 9. Severe acquired spinal stenosis L4-5. Electronically Signed   By: Telford Nab M.D.   On: 03/10/2022 00:41    Anti-infectives: Anti-infectives (From admission, onward)    Start     Dose/Rate Route Frequency Ordered Stop   03/10/22 1000  piperacillin-tazobactam (ZOSYN) IVPB 3.375 g        3.375 g 12.5 mL/hr over 240 Minutes Intravenous Every 8 hours 03/10/22 0541     03/10/22 0200  piperacillin-tazobactam (ZOSYN) IVPB 3.375 g        3.375 g 100 mL/hr over 30 Minutes Intravenous  Once 03/10/22 0148 03/10/22 0256       Assessment/Plan:  Patient is an 81 year old female who was admitted with concerns for cholecystitis.  CT demonstrating distended gallbladder with mild wall thickening pericholecystic fluid; ultrasound demonstrating no acute signs for cholecystitis.  -Patient continues to have benign abdominal exam with no associated pain, nausea, or vomiting -HIDA scan ordered for this morning to evaluate for cholecystitis -Leukocytosis slightly improved this morning, 17.8 from 20.3 -Continue IV Zosyn -T. 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

## 2022-03-12 ENCOUNTER — Other Ambulatory Visit: Payer: Self-pay

## 2022-03-12 ENCOUNTER — Inpatient Hospital Stay (HOSPITAL_COMMUNITY): Payer: Medicare HMO | Admitting: Certified Registered"

## 2022-03-12 ENCOUNTER — Encounter (HOSPITAL_COMMUNITY): Payer: Self-pay | Admitting: Internal Medicine

## 2022-03-12 ENCOUNTER — Inpatient Hospital Stay (HOSPITAL_COMMUNITY): Payer: Medicare HMO

## 2022-03-12 ENCOUNTER — Encounter (HOSPITAL_COMMUNITY)
Admission: EM | Disposition: A | Discharge status: Home / Self Care | Payer: Self-pay | Source: Home / Self Care | Attending: Internal Medicine

## 2022-03-12 DIAGNOSIS — I10 Essential (primary) hypertension: Secondary | ICD-10-CM | POA: Diagnosis not present

## 2022-03-12 DIAGNOSIS — R1033 Periumbilical pain: Secondary | ICD-10-CM | POA: Diagnosis not present

## 2022-03-12 DIAGNOSIS — K810 Acute cholecystitis: Secondary | ICD-10-CM

## 2022-03-12 DIAGNOSIS — K8000 Calculus of gallbladder with acute cholecystitis without obstruction: Secondary | ICD-10-CM

## 2022-03-12 DIAGNOSIS — K219 Gastro-esophageal reflux disease without esophagitis: Secondary | ICD-10-CM | POA: Diagnosis not present

## 2022-03-12 DIAGNOSIS — E876 Hypokalemia: Secondary | ICD-10-CM | POA: Diagnosis not present

## 2022-03-12 HISTORY — PX: CHOLECYSTECTOMY: SHX55

## 2022-03-12 HISTORY — PX: LYSIS OF ADHESION: SHX5961

## 2022-03-12 LAB — COMPREHENSIVE METABOLIC PANEL
ALT: 18 U/L (ref 0–44)
AST: 18 U/L (ref 15–41)
Albumin: 3.1 g/dL — ABNORMAL LOW (ref 3.5–5.0)
Alkaline Phosphatase: 72 U/L (ref 38–126)
Anion gap: 7 (ref 5–15)
BUN: 16 mg/dL (ref 8–23)
CO2: 27 mmol/L (ref 22–32)
Calcium: 7.9 mg/dL — ABNORMAL LOW (ref 8.9–10.3)
Chloride: 104 mmol/L (ref 98–111)
Creatinine, Ser: 1.02 mg/dL — ABNORMAL HIGH (ref 0.44–1.00)
GFR, Estimated: 55 mL/min — ABNORMAL LOW (ref >60)
Glucose, Bld: 82 mg/dL (ref 70–99)
Potassium: 3.5 mmol/L (ref 3.5–5.1)
Sodium: 138 mmol/L (ref 135–145)
Total Bilirubin: 3.6 mg/dL — ABNORMAL HIGH (ref 0.3–1.2)
Total Protein: 5.9 g/dL — ABNORMAL LOW (ref 6.5–8.1)

## 2022-03-12 LAB — CBC
HCT: 34.3 % — ABNORMAL LOW (ref 36.0–46.0)
Hemoglobin: 11.3 g/dL — ABNORMAL LOW (ref 12.0–15.0)
MCH: 28.5 pg (ref 26.0–34.0)
MCHC: 32.9 g/dL (ref 30.0–36.0)
MCV: 86.6 fL (ref 80.0–100.0)
Platelets: 194 10*3/uL (ref 150–400)
RBC: 3.96 MIL/uL (ref 3.87–5.11)
RDW: 15.9 % — ABNORMAL HIGH (ref 11.5–15.5)
WBC: 14.9 10*3/uL — ABNORMAL HIGH (ref 4.0–10.5)
nRBC: 0 % (ref 0.0–0.2)

## 2022-03-12 LAB — ABO/RH: ABO/RH(D): O POS

## 2022-03-12 LAB — GLUCOSE, CAPILLARY: Glucose-Capillary: 145 mg/dL — ABNORMAL HIGH (ref 70–99)

## 2022-03-12 LAB — PREPARE RBC (CROSSMATCH)

## 2022-03-12 SURGERY — LAPAROSCOPIC CHOLECYSTECTOMY
Anesthesia: General | Site: Abdomen

## 2022-03-12 MED ORDER — SODIUM CHLORIDE 0.9 % IR SOLN
Status: DC | PRN
  Administered 2022-03-12: 1000 mL
  Administered 2022-03-12: 3000 mL

## 2022-03-12 MED ORDER — SUGAMMADEX SODIUM 200 MG/2ML IV SOLN
INTRAVENOUS | Status: DC | PRN
  Administered 2022-03-12: 100 mg via INTRAVENOUS
  Administered 2022-03-12: 50 mg via INTRAVENOUS

## 2022-03-12 MED ORDER — DEXAMETHASONE SODIUM PHOSPHATE 10 MG/ML IJ SOLN
INTRAMUSCULAR | Status: DC | PRN
  Administered 2022-03-12: 10 mg via INTRAVENOUS

## 2022-03-12 MED ORDER — DEXAMETHASONE SODIUM PHOSPHATE 10 MG/ML IJ SOLN
INTRAMUSCULAR | Status: AC
  Filled 2022-03-12: qty 1

## 2022-03-12 MED ORDER — BISACODYL 10 MG RE SUPP
10.0000 mg | Freq: Every day | RECTAL | Status: DC
Start: 2022-03-12 — End: 2022-03-14
  Administered 2022-03-12: 10 mg via RECTAL
  Filled 2022-03-12: qty 1

## 2022-03-12 MED ORDER — FENTANYL CITRATE (PF) 100 MCG/2ML IJ SOLN
INTRAMUSCULAR | Status: DC | PRN
Start: 2022-03-12 — End: 2022-03-12
  Administered 2022-03-12 (×2): 25 ug via INTRAVENOUS
  Administered 2022-03-12 (×2): 50 ug via INTRAVENOUS

## 2022-03-12 MED ORDER — ACETAMINOPHEN 500 MG PO TABS
1000.0000 mg | ORAL_TABLET | Freq: Four times a day (QID) | ORAL | Status: DC
  Administered 2022-03-12 – 2022-03-14 (×8): 1000 mg via ORAL
  Filled 2022-03-12 (×8): qty 2

## 2022-03-12 MED ORDER — CHLORHEXIDINE GLUCONATE 0.12 % MT SOLN
OROMUCOSAL | Status: AC
  Filled 2022-03-12: qty 15

## 2022-03-12 MED ORDER — BISACODYL 10 MG RE SUPP: 10.0000 mg | Freq: Every day | RECTAL | Status: DC

## 2022-03-12 MED ORDER — FENTANYL CITRATE (PF) 100 MCG/2ML IJ SOLN
INTRAMUSCULAR | Status: AC
  Filled 2022-03-12: qty 2

## 2022-03-12 MED ORDER — ROCURONIUM BROMIDE 10 MG/ML (PF) SYRINGE
PREFILLED_SYRINGE | INTRAVENOUS | Status: AC
  Filled 2022-03-12: qty 10

## 2022-03-12 MED ORDER — BUPIVACAINE HCL (PF) 0.5 % IJ SOLN
INTRAMUSCULAR | Status: AC
  Filled 2022-03-12: qty 30

## 2022-03-12 MED ORDER — BUPIVACAINE HCL (PF) 0.5 % IJ SOLN
INTRAMUSCULAR | Status: DC | PRN
  Administered 2022-03-12: 14 mL

## 2022-03-12 MED ORDER — CHLORHEXIDINE GLUCONATE CLOTH 2 % EX PADS
6.0000 | MEDICATED_PAD | Freq: Every day | CUTANEOUS | Status: DC
  Administered 2022-03-12 – 2022-03-14 (×3): 6 via TOPICAL

## 2022-03-12 MED ORDER — HEMOSTATIC AGENTS (NO CHARGE) OPTIME
TOPICAL | Status: DC | PRN
  Administered 2022-03-12 (×3): 1 via TOPICAL

## 2022-03-12 MED ORDER — CHLORHEXIDINE GLUCONATE 0.12 % MT SOLN: 15.0000 mL | Freq: Once | OROMUCOSAL | Status: DC

## 2022-03-12 MED ORDER — SENNOSIDES-DOCUSATE SODIUM 8.6-50 MG PO TABS
1.0000 | ORAL_TABLET | Freq: Two times a day (BID) | ORAL | Status: DC
  Administered 2022-03-12 – 2022-03-14 (×4): 1 via ORAL
  Filled 2022-03-12 (×4): qty 1

## 2022-03-12 MED ORDER — PROPOFOL 10 MG/ML IV BOLUS
INTRAVENOUS | Status: DC | PRN
  Administered 2022-03-12: 140 mg via INTRAVENOUS

## 2022-03-12 MED ORDER — PHENYLEPHRINE 80 MCG/ML (10ML) SYRINGE FOR IV PUSH (FOR BLOOD PRESSURE SUPPORT)
PREFILLED_SYRINGE | INTRAVENOUS | Status: AC
  Filled 2022-03-12: qty 20

## 2022-03-12 MED ORDER — MORPHINE SULFATE (PF) 2 MG/ML IV SOLN: 2.0000 mg | INTRAVENOUS | Status: DC | PRN

## 2022-03-12 MED ORDER — ONDANSETRON HCL 4 MG/2ML IJ SOLN
4.0000 mg | Freq: Once | INTRAMUSCULAR | Status: AC | PRN
  Administered 2022-03-12: 4 mg via INTRAVENOUS
  Filled 2022-03-12: qty 2

## 2022-03-12 MED ORDER — POLYETHYLENE GLYCOL 3350 17 G PO PACK
17.0000 g | PACK | Freq: Every day | ORAL | Status: DC
  Administered 2022-03-12 – 2022-03-14 (×3): 17 g via ORAL
  Filled 2022-03-12 (×3): qty 1

## 2022-03-12 MED ORDER — ORAL CARE MOUTH RINSE: 15.0000 mL | OROMUCOSAL | Status: DC | PRN

## 2022-03-12 MED ORDER — ORAL CARE MOUTH RINSE: 15.0000 mL | Freq: Once | OROMUCOSAL | Status: DC

## 2022-03-12 MED ORDER — LIDOCAINE HCL (PF) 2 % IJ SOLN
INTRAMUSCULAR | Status: AC
  Filled 2022-03-12: qty 5

## 2022-03-12 MED ORDER — LACTATED RINGERS IV SOLN: INTRAVENOUS | Status: DC

## 2022-03-12 MED ORDER — ONDANSETRON HCL 4 MG/2ML IJ SOLN
INTRAMUSCULAR | Status: DC | PRN
  Administered 2022-03-12: 4 mg via INTRAVENOUS

## 2022-03-12 MED ORDER — ROCURONIUM BROMIDE 100 MG/10ML IV SOLN
INTRAVENOUS | Status: DC | PRN
  Administered 2022-03-12: 50 mg via INTRAVENOUS
  Administered 2022-03-12: 10 mg via INTRAVENOUS

## 2022-03-12 MED ORDER — FENTANYL CITRATE PF 50 MCG/ML IJ SOSY: 25.0000 ug | PREFILLED_SYRINGE | INTRAMUSCULAR | Status: DC | PRN

## 2022-03-12 MED ORDER — OXYCODONE HCL 5 MG PO TABS
5.0000 mg | ORAL_TABLET | ORAL | Status: DC | PRN
  Filled 2022-03-12: qty 1

## 2022-03-12 MED ORDER — ONDANSETRON HCL 4 MG/2ML IJ SOLN
INTRAMUSCULAR | Status: AC
  Filled 2022-03-12: qty 2

## 2022-03-12 MED ORDER — PHENYLEPHRINE HCL (PRESSORS) 10 MG/ML IV SOLN
INTRAVENOUS | Status: DC | PRN
  Administered 2022-03-12: 80 ug via INTRAVENOUS

## 2022-03-12 SURGICAL SUPPLY — 50 items
ADH SKN CLS APL DERMABOND .7 (GAUZE/BANDAGES/DRESSINGS) ×1
APL ESCP 73.6OZ SRGCL (TIP) ×1
APL PRP STRL LF DISP 70% ISPRP (MISCELLANEOUS) ×1
APPLIER CLIP ROT 10 11.4 M/L (STAPLE) ×1
APR CLP MED LRG 11.4X10 (STAPLE) ×1
BLADE SURG 15 STRL LF DISP TIS (BLADE) ×1 IMPLANT
BLADE SURG 15 STRL SS (BLADE) ×1
CHLORAPREP W/TINT 26 (MISCELLANEOUS) ×1 IMPLANT
CLIP APPLIE ROT 10 11.4 M/L (STAPLE) ×1 IMPLANT
CLOTH BEACON ORANGE TIMEOUT ST (SAFETY) ×1 IMPLANT
COVER LIGHT HANDLE STERIS (MISCELLANEOUS) ×2 IMPLANT
DECANTER SPIKE VIAL GLASS SM (MISCELLANEOUS) ×1 IMPLANT
DERMABOND ADVANCED (GAUZE/BANDAGES/DRESSINGS) ×1
DERMABOND ADVANCED .7 DNX12 (GAUZE/BANDAGES/DRESSINGS) ×1 IMPLANT
ELECT REM PT RETURN 9FT ADLT (ELECTROSURGICAL) ×1
ELECTRODE REM PT RTRN 9FT ADLT (ELECTROSURGICAL) ×1 IMPLANT
GAUZE 4X4 16PLY ~~LOC~~+RFID DBL (SPONGE) IMPLANT
GLOVE BIO SURGEON STRL SZ 6.5 (GLOVE) IMPLANT
GLOVE BIOGEL PI IND STRL 6.5 (GLOVE) ×1 IMPLANT
GLOVE BIOGEL PI IND STRL 7.0 (GLOVE) ×2 IMPLANT
GLOVE BIOGEL PI INDICATOR 6.5 (GLOVE) ×2
GLOVE BIOGEL PI INDICATOR 7.0 (GLOVE) ×2
GLOVE SURG SS PI 6.5 STRL IVOR (GLOVE) ×2 IMPLANT
GOWN STRL REUS W/TWL LRG LVL3 (GOWN DISPOSABLE) ×3 IMPLANT
HEMOSTAT SNOW SURGICEL 2X4 (HEMOSTASIS) ×1 IMPLANT
INST SET LAPROSCOPIC AP (KITS) ×1 IMPLANT
IV NS IRRIG 3000ML ARTHROMATIC (IV SOLUTION) IMPLANT
KIT TURNOVER KIT A (KITS) ×1 IMPLANT
MANIFOLD NEPTUNE II (INSTRUMENTS) ×1 IMPLANT
NDL INSUFFLATION 14GA 120MM (NEEDLE) ×1 IMPLANT
NEEDLE INSUFFLATION 14GA 120MM (NEEDLE) ×1 IMPLANT
NS IRRIG 1000ML POUR BTL (IV SOLUTION) ×1 IMPLANT
PACK LAP CHOLE LZT030E (CUSTOM PROCEDURE TRAY) ×1 IMPLANT
PAD ARMBOARD 7.5X6 YLW CONV (MISCELLANEOUS) ×1 IMPLANT
PENCIL HANDSWITCHING (ELECTRODE) IMPLANT
POWDER SURGICEL 3.0 GRAM (HEMOSTASIS) IMPLANT
SET BASIN LINEN APH (SET/KITS/TRAYS/PACK) ×1 IMPLANT
SET TUBE IRRIG SUCTION NO TIP (IRRIGATION / IRRIGATOR) IMPLANT
SET TUBE SMOKE EVAC HIGH FLOW (TUBING) ×1 IMPLANT
SLEEVE Z-THREAD 5X100MM (TROCAR) ×1 IMPLANT
SUT MNCRL AB 4-0 PS2 18 (SUTURE) ×2 IMPLANT
SUT VICRYL 0 UR6 27IN ABS (SUTURE) ×1 IMPLANT
SYS BAG RETRIEVAL 10MM (BASKET) ×1
SYSTEM BAG RETRIEVAL 10MM (BASKET) ×1 IMPLANT
TIP ENDOSCOPIC SURGICEL (TIP) IMPLANT
TROCAR Z-THRD FIOS HNDL 11X100 (TROCAR) ×1 IMPLANT
TROCAR Z-THREAD FIOS 5X100MM (TROCAR) ×1 IMPLANT
TROCAR Z-THREAD SLEEVE 11X100 (TROCAR) ×1 IMPLANT
TUBE CONNECTING 12X1/4 (SUCTIONS) ×1 IMPLANT
WARMER LAPAROSCOPE (MISCELLANEOUS) ×1 IMPLANT

## 2022-03-12 NOTE — Progress Notes (Signed)
Pt has denied any abdominal pain or nausea throughout the shift.

## 2022-03-12 NOTE — Anesthesia Procedure Notes (Signed)
Procedure Name: Intubation Date/Time: 03/12/2022 12:48 PM  Performed by: Myna Bright, CRNAPre-anesthesia Checklist: Patient identified, Emergency Drugs available, Suction available and Patient being monitored Patient Re-evaluated:Patient Re-evaluated prior to induction Oxygen Delivery Method: Circle system utilized Preoxygenation: Pre-oxygenation with 100% oxygen Induction Type: IV induction Ventilation: Mask ventilation without difficulty and Oral airway inserted - appropriate to patient size Laryngoscope Size: Mac and 3 Grade View: Grade I Tube type: Oral Tube size: 7.0 mm Number of attempts: 1 Airway Equipment and Method: Stylet Placement Confirmation: ETT inserted through vocal cords under direct vision, positive ETCO2 and breath sounds checked- equal and bilateral Secured at: 21 cm Tube secured with: Tape Dental Injury: Teeth and Oropharynx as per pre-operative assessment  Comments: Patient aspirated small amount of bile on induction. See quick note.

## 2022-03-12 NOTE — Op Note (Addendum)
Operative Note   Preoperative Diagnosis: Acute cholecystitis   Postoperative Diagnosis: Same   Procedure(s) Performed: Laparoscopic cholecystectomy, Lysis of adhesions   Surgeon: Graciella Freer, DO    Assistants: Curlene Labrum, MD; Leeann Must, RN   Anesthesia: General endotracheal   Anesthesiologist: Louann Sjogren, MD    Specimens: Gallbladder    Estimated Blood Loss: 300 cc   Blood Replacement: None    Complications: None   Indications: Patient is a 81 year old female who was admitted to the hospital with concern for acute cholecystitis.  Her CT was initially concerning, and then all abdominal ultrasound demonstrated no inflammatory changes.  She subsequently underwent a HIDA scan, which demonstrated nonvisualization of the gallbladder consistent with acute cholecystitis.  She also underwent an MRCP which demonstrated no choledocholithiasis and inflammatory changes of the gallbladder.  She is agreeable to laparoscopic cholecystectomy at this time.  All risks and benefits of performing this procedure were discussed with the patient including pain, infection, bleeding, damage to the surrounding structures, and need for more procedures or surgery. The patient voiced understanding of the procedure, all questions were sought and answered, and consent was obtained.   Operative Findings: Significantly inflamed and distended gallbladder with very friable liver, intra-abdominal adhesions   Procedure: The patient was taken to the operating room and placed supine. General endotracheal anesthesia was induced.  Patient is receiving scheduled IV Zosyn on the floor.  Received last dose intraoperatively.  An orogastric tube positioned to decompress the stomach. The abdomen was prepared and draped in the usual sterile fashion.    A supraumbilical incision was made and a Veress technique was utilized to achieve pneumoperitoneum to 15 mmHg with carbon dioxide. A 11 mm optiview port was  placed through the supraumbilical region, and a 10 mm 0-degree operative laparoscope was introduced.  Patient was noted to immediately have significant adhesions along the entrance site.  The scope was able to be turned towards the gallbladder, and the epigastric 11 mm port was placed near the falciform ligament.  The camera was moved to this new port, and the intra-abdominal adhesions were assessed.  Using a finger through the incision site, the adhesions to the abdominal wall were partially taken down with blunt dissection, until the port positioning allowed for appropriate visualization of the gallbladder.  The area underlying the trocar and Veress needle were inspected and without evidence of injury.  Remaining trocars were placed under direct vision. Two 5 mm ports were placed in the right abdomen, between the anterior axillary and midclavicular line.     The gallbladder was noted to be significantly inflamed with omentum overlying the fundus.  The gallbladder was also significantly distended.  Decompression was performed.  The gallbladder fundus was elevated cephalad and the infundibulum was retracted to the patient's right.  Given the significant amount of inflammation, a dome down approach to the gallbladder was performed.  The gallbladder was clearly able to be dissected off of the friable liver bed.  The gallbladder was dissected down to a point where only 2 structures were noted to be entering the gallbladder.  The cystic duct was skeletonized, as well as the cystic artery.   The cystic duct and cystic artery were doubly clipped and divided. The specimen was placed in an Endopouch and was retrieved through the epigastric site.  The gallbladder was also inspected ex vivo to confirm only 2 structures entering.  Dr. Constance Haw helped with critical portions of the case, including dissection of cystic duct and artery, dissection  of gallbladder off liver bed, and hemostasis.   The liver was noted to be  extremely friable, requiring electrocautery to obtain hemostasis.  Final inspection revealed acceptable hemostasis.  Surgicel powder and surgical SNOW were placed in the gallbladder bed.  Trocars were removed and pneumoperitoneum was released.  0 Vicryl fascial sutures were used to close the epigastric and umbilical port sites. Skin incisions were closed with 4-0 Monocryl subcuticular sutures and Dermabond. The patient was awakened from anesthesia and extubated without complication.    Graciella Freer, DO  [facility] [address] [place], [place] [id number] [phone] (office)

## 2022-03-12 NOTE — Discharge Instructions (Signed)
Surgery Discharge Instructions  Activity  You are advised to go directly home from the hospital.  Resume light activity. No heavy lifting over 10 lbs or strenuous exercise.  Fluids and Diet Regular diet  Medications  If you have not had a bowel movement in 24 hours, take 2 tablespoons over the counter Milk of mag.             You May resume your blood thinners tomorrow (Aspirin, coumadin, or other).  You are being discharged with prescriptions for Opioid/Narcotic Medications: There are some specific considerations for these medications that you should know. Opioid Meds have risks & benefits. Addiction to these meds is always a concern with prolonged use Take medication only as directed Do not drive while taking narcotic pain medication Do not crush tablets or capsules Do not use a different container than medication was dispensed in Lock the container of medication in a cool, dry place out of reach of children and pets. Opioid medication can cause addiction Do not share with anyone else (this is a felony) Do not store medications for future use. Dispose of them properly.     Disposal:  Find a [employer] household drug take back site near you.  If you can't get to a drug take back site, use the recipe below as a last resort to dispose of expired, unused or unwanted drugs. Disposal  (Do not dispose chemotherapy drugs this way, talk to your prescribing doctor instead.) Step 1: Mix drugs (do not crush) with dirt, kitty litter, or used coffee grounds and add a small amount of water to dissolve any solid medications. Step 2: Seal drugs in plastic bag. Step 3: Place plastic bag in trash. Step 4: Take prescription container and scratch out personal information, then recycle or throw away.  Operative Site  You have a liquid bandage over your incisions, this will begin to flake off in about a week. Ok to Games developer. Keep wound clean and dry. No baths or swimming. No lifting more than 10  pounds.  Contact Information: If you have questions or concerns, please call our office, [phone], Monday- Thursday 8AM-5PM and Friday 8AM-12Noon.  If it is after hours or on the weekend, please call [facility] Main Number, [phone], and ask to speak to the surgeon on call for Dr. Okey Dupre at [facility].   SPECIFIC COMPLICATIONS TO WATCH FOR: Inability to urinate Fever over 101? F by mouth Nausea and vomiting lasting longer than 24 hours. Pain not relieved by medication ordered Swelling around the operative site Increased redness, warmth, hardness, around operative area Numbness, tingling, or cold fingers or toes Blood -soaked dressing, (small amounts of oozing may be normal) Increasing and progressive drainage from surgical area or exam site    IMPORTANT INFORMATION: PAY CLOSE ATTENTION   PHYSICIAN DISCHARGE INSTRUCTIONS  Follow with Primary care provider  Iona Beard, MD  and other consultants as instructed by your Hospitalist Physician  [place], WORSEN OR NEW PROBLEM DEVELOPS   Please note: You were cared for by a hospitalist during your hospital stay. Every effort will be made to forward records to your primary care provider.  You can request that your primary care provider send for your hospital records if they have not received them.  Once you are discharged, your primary care physician will handle any further medical issues. Please note that NO REFILLS for any discharge medications will be authorized once you are discharged,  as it is imperative that you return to your primary care physician (or establish a relationship with a primary care physician if you do not have one) for your post hospital discharge needs so that they can reassess your need for medications and monitor your lab values.  Please get a complete blood count and chemistry panel checked by your Primary MD at your next visit, and again as instructed  by your Primary MD.  Get Medicines reviewed and adjusted: Please take all your medications with you for your next visit with your Primary MD  Laboratory/radiological data: Please request your Primary MD to go over all hospital tests and procedure/radiological results at the follow up, please ask your primary care provider to get all Hospital records sent to his/her office.  In some cases, they will be blood work, cultures and biopsy results pending at the time of your discharge. Please request that your primary care provider follow up on these results.  If you are diabetic, please bring your blood sugar readings with you to your follow up appointment with primary care.    Please call and make your follow up appointments as soon as possible.    Also Note the following: If you experience worsening of your admission symptoms, develop shortness of breath, life threatening emergency, suicidal or homicidal thoughts you must seek medical attention immediately by calling 911 or calling your MD immediately  if symptoms less severe.  You must read complete instructions/literature along with all the possible adverse reactions/side effects for all the Medicines you take and that have been prescribed to you. Take any new Medicines after you have completely understood and accpet all the possible adverse reactions/side effects.   Do not drive when taking Pain medications or sleeping medications (Benzodiazepines)  Do not take more than prescribed Pain, Sleep and Anxiety Medications. It is not advisable to combine anxiety,sleep and pain medications without talking with your primary care practitioner  Special Instructions: If you have smoked or chewed Tobacco  in the last 2 yrs please stop smoking, stop any regular Alcohol  and or any Recreational drug use.  Wear Seat belts while driving.  Do not drive if taking any narcotic, mind altering or controlled substances or recreational drugs or alcohol.

## 2022-03-12 NOTE — Progress Notes (Signed)
[facility] Progress Note     Subjective: Patient seen and examined.  She is resting comfortably in bed.  She denies nausea or vomiting with clear liquids yesterday, and she denies abdominal pain.  Objective: Vital signs in last 24 hours: Temp:  [98.1 F (36.7 C)-99.4 F (37.4 C)] 98.3 F (36.8 C) (08/29 0548) Pulse Rate:  [64-87] 65 (08/29 0831) Resp:  [18-20] 20 (08/28 1633) BP: (94-109)/(53-66) 109/61 (08/29 0831) SpO2:  [95 %-98 %] 97 % (08/29 0831) Last BM Date : 03/07/22  Intake/Output from previous day: 08/28 0701 - 08/29 0700 In: 2366.8 [I.V.:2316.8; IV Piggyback:50] Out: 200 [Urine:200] Intake/Output this shift: No intake/output data recorded.  General appearance: alert, cooperative, and no distress GI: Abdomen soft, nondistended, no percussion tenderness, non tender to palpation; no rigidity, guarding, or rebound tenderness  Lab Results:  Recent Labs    03/11/22 0909 03/12/22 0404  WBC 17.8* 14.9*  HGB 12.5 11.3*  HCT 37.2 34.3*  PLT 218 194   BMET Recent Labs    03/11/22 0909 03/12/22 0404  NA 137 138  K 3.3* 3.5  CL 102 104  CO2 27 27  GLUCOSE 103* 82  BUN 12 16  CREATININE 0.93 1.02*  CALCIUM 8.3* 7.9*   PT/INR No results for input(s): "LABPROT", "INR" in the last 72 hours.  Studies/Results: NM Hepato W/EF  Result Date: 03/11/2022 CLINICAL DATA:  Abdominal pain, cholelithiasis, question acute cholecystitis EXAM: NUCLEAR MEDICINE HEPATOBILIARY IMAGING TECHNIQUE: Sequential images of the abdomen were obtained out to 60 minutes following intravenous administration of radiopharmaceutical. RADIOPHARMACEUTICALS:  6.7 mCi Tc-39mCholetec IV (5.5 mCi + 1.2 mCi) COMPARISON:  CT abdomen and pelvis and ultrasound abdomen 03/10/2022 FINDINGS: Normal tracer extraction from bloodstream indicating normal hepatocellular function. Prompt excretion of tracer into biliary tree. Small bowel visualized by 19 minutes. At 1 hour, gallbladder had not  visualized. Patient was reinjected with additional tracer and 3 mg of morphine IV. Imaging for 30 minutes fails to demonstrate tracer within the gallbladder. Findings are consistent with cystic duct obstruction and acute cholecystitis. IMPRESSION: Nonvisualization of the gallbladder despite morphine augmentation consistent with acute cholecystitis. Patent CBD. Electronically Signed   By: MLavonia DanaM.D.   On: 03/11/2022 13:48   MR ABDOMEN W WO CONTRAST  Result Date: 03/11/2022 CLINICAL DATA:  Splenic mass identified by CT EXAM: MRI ABDOMEN WITHOUT AND WITH CONTRAST TECHNIQUE: Multiplanar multisequence MR imaging of the abdomen was performed both before and after the administration of intravenous contrast. CONTRAST:  747mGADAVIST GADOBUTROL 1 MMOL/ML IV SOLN COMPARISON:  CT abdomen pelvis, 03/10/2022 FINDINGS: Lower chest: No acute abnormality. Hepatobiliary: No solid liver abnormality is seen. Numerous tiny gallstones in the dependent gallbladder (series 4, image 24). Similar pericholecystic edema (series 3, image 16). No biliary ductal dilatation. No gallstones identified to the ampulla. Pancreas: Unremarkable. No pancreatic ductal dilatation or surrounding inflammatory changes. Spleen: Spleen is enlarged by the presence of a mass, maximum coronal span 15.5 cm. Rounded, faintly circumscribed mass occupying essentially the entirety of the superior spleen is nearly isosignal to splenic parenchyma, minimally T2 hyperintense, with heterogeneous hypoenhancement to splenic parenchyma on multiphasic contrast enhanced sequences. This lesion measures 10.0 x 8.5 x 9.7 cm (series 4, image 16, series 3, image 22). Adrenals/Urinary Tract: Definitively benign, macroscopic fat containing right adrenal adenoma, for which no further follow-up or characterization is required (series 12, image 25). Normal left adrenal gland. Kidneys are normal, without renal calculi, solid lesion, or hydronephrosis. Stomach/Bowel: Stomach is  within normal  limits. No evidence of bowel wall thickening, distention, or inflammatory changes. Vascular/Lymphatic: No significant vascular findings are present. No enlarged abdominal lymph nodes. Other: No abdominal wall hernia or abnormality. No ascites. Musculoskeletal: No acute or significant osseous findings. IMPRESSION: 1. Cholelithiasis with pericholecystic edema, again concerning for acute cholecystitis. No evidence of choledocholithiasis or biliary ductal dilatation. 2. Rounded, faintly circumscribed mass occupying essentially the entirety of the superior spleen, measuring 10.0 x 8.5 x 9.7 cm, with heterogeneous hypoenhancement to splenic parenchyma on multiphasic contrast enhanced sequences. 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 Malignancy or metastatic disease can not however be excluded on the basis of imaging. Consider short interval follow-up in 3-6 months to ensure stability. 3. Definitively benign, macroscopic fat containing right adrenal adenoma, for which no further follow-up or characterization is required. Electronically Signed   By: Delanna Ahmadi M.D.   On: 03/11/2022 12:05   MR 3D Recon At Scanner  Result Date: 03/11/2022 CLINICAL DATA:  Splenic mass identified by CT EXAM: MRI ABDOMEN WITHOUT AND WITH CONTRAST TECHNIQUE: Multiplanar multisequence MR imaging of the abdomen was performed both before and after the administration of intravenous contrast. CONTRAST:  77m GADAVIST GADOBUTROL 1 MMOL/ML IV SOLN COMPARISON:  CT abdomen pelvis, 03/10/2022 FINDINGS: Lower chest: No acute abnormality. Hepatobiliary: No solid liver abnormality is seen. Numerous tiny gallstones in the dependent gallbladder (series 4, image 24). Similar pericholecystic edema (series 3, image 16). No biliary ductal dilatation. No gallstones identified to the ampulla. Pancreas: Unremarkable. No pancreatic ductal dilatation or  surrounding inflammatory changes. Spleen: Spleen is enlarged by the presence of a mass, maximum coronal span 15.5 cm. Rounded, faintly circumscribed mass occupying essentially the entirety of the superior spleen is nearly isosignal to splenic parenchyma, minimally T2 hyperintense, with heterogeneous hypoenhancement to splenic parenchyma on multiphasic contrast enhanced sequences. This lesion measures 10.0 x 8.5 x 9.7 cm (series 4, image 16, series 3, image 22). Adrenals/Urinary Tract: Definitively benign, macroscopic fat containing right adrenal adenoma, for which no further follow-up or characterization is required (series 12, image 25). Normal left adrenal gland. Kidneys are normal, without renal calculi, solid lesion, or hydronephrosis. Stomach/Bowel: Stomach is within normal limits. No evidence of bowel wall thickening, distention, or inflammatory changes. Vascular/Lymphatic: No significant vascular findings are present. No enlarged abdominal lymph nodes. Other: No abdominal wall hernia or abnormality. No ascites. Musculoskeletal: No acute or significant osseous findings. IMPRESSION: 1. Cholelithiasis with pericholecystic edema, again concerning for acute cholecystitis. No evidence of choledocholithiasis or biliary ductal dilatation. 2. Rounded, faintly circumscribed mass occupying essentially the entirety of the superior spleen, measuring 10.0 x 8.5 x 9.7 cm, with heterogeneous hypoenhancement to splenic parenchyma on multiphasic contrast enhanced sequences. 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 Malignancy or metastatic disease can not however be excluded on the basis of imaging. Consider short interval follow-up in 3-6 months to ensure stability. 3. Definitively benign, macroscopic fat containing right adrenal adenoma, for which no further follow-up or characterization is required. Electronically Signed   By: ADelanna AhmadiM.D.   On: 03/11/2022 12:05   UKoreaAbdomen Limited  Result Date: 03/10/2022 CLINICAL DATA:  Mid abdominal pain. Findings concerning for acute cholecystitis on CT imaging. EXAM: ULTRASOUND ABDOMEN LIMITED RIGHT UPPER QUADRANT COMPARISON:  CT of the abdomen and pelvis March 10, 2022 FINDINGS: Gallbladder: Numerous tiny stones in the gallbladder without wall thickening, pericholecystic  fluid, or Murphy's sign. Common bile duct: Diameter: 3.5 mm Liver: No focal lesion identified. Within normal limits in parenchymal echogenicity. Portal vein is patent on color Doppler imaging with normal direction of blood flow towards the liver. Other: None. IMPRESSION: 1. Cholelithiasis without wall thickening, pericholecystic fluid, or Murphy's sign. On the CT scan from earlier today, there is distension of the gallbladder and mild increased attenuation in the pericholecystic fat. As result, the findings on the ultrasound do not demonstrate evidence of acute cholecystitis but the findings on CT imaging are concerning for acute cholecystitis. Recommend clinical correlation. If the clinical picture remains ambiguous, recommend a HIDA scan for further evaluation. 2. No other abnormalities. Electronically Signed   By: Dorise Bullion III M.D.   On: 03/10/2022 10:24    Anti-infectives: Anti-infectives (From admission, onward)    Start     Dose/Rate Route Frequency Ordered Stop   03/10/22 1000  piperacillin-tazobactam (ZOSYN) IVPB 3.375 g        3.375 g 12.5 mL/hr over 240 Minutes Intravenous Every 8 hours 03/10/22 0541     03/10/22 0200  piperacillin-tazobactam (ZOSYN) IVPB 3.375 g        3.375 g 100 mL/hr over 30 Minutes Intravenous  Once 03/10/22 0148 03/10/22 0256       Assessment/Plan:  Patient is an 81 year old female who was admitted with for concern for acute cholecystitis.  CT was demonstrating concern for acute cholecystitis, and ultrasound demonstrated no acute signs for cholecystitis.  -Patient  underwent both MRI/MRCP of abdomen and HIDA scan yesterday.   -HIDA demonstrates nonfilling of the gallbladder even after morphine administration, concerning for acute cholecystitis. -MRI/MRCP again demonstrates concern for acute cholecystitis, no evidence of choledocholithiasis, and right adrenal adenoma -Given findings of acute cholecystitis with no concern for pheochromocytoma in the right adrenal, will plan for laparoscopic cholecystectomy today -I counseled the patient about the indication, risks and benefits of laparoscopic cholecystectomy.  She understands there is a very small chance for bleeding, infection, injury to normal structures (including common bile duct), conversion to open surgery, persistent symptoms, evolution of postcholecystectomy diarrhea, need for secondary interventions, anesthesia reaction, cardiopulmonary issues and other risks not specifically detailed here. I described the expected recovery, the plan for follow-up and the restrictions during the recovery phase.  All questions were answered. -NPO -IV fluids -PRN pain control and antiemetics -Leukocytosis downtrending, 14.9 from 17.8 -Continue IV Zosyn -Total bilirubin slightly downtrending and MRCP without evidence of choledocholithiasis, will recommend repeat blood work tomorrow -Appreciate hospitalist recommendations    LOS: 2 days    Jaiveer Panas A [facility] 03/12/2022

## 2022-03-12 NOTE — Anesthesia Preprocedure Evaluation (Signed)
Anesthesia Evaluation  Patient identified by MRN, date of birth, ID band Patient awake    Reviewed: Allergy & Precautions, H&P , NPO status , Patient's Chart, lab work & pertinent test results, reviewed documented beta blocker date and time   Airway Mallampati: II  TM Distance: >3 FB Neck ROM: full    Dental no notable dental hx.    Pulmonary neg pulmonary ROS,    Pulmonary exam normal breath sounds clear to auscultation       Cardiovascular Exercise Tolerance: Good hypertension, negative cardio ROS   Rhythm:regular Rate:Normal     Neuro/Psych  Neuromuscular disease negative psych ROS   GI/Hepatic Neg liver ROS, GERD  Medicated,  Endo/Other  negative endocrine ROS  Renal/GU negative Renal ROS  negative genitourinary   Musculoskeletal   Abdominal   Peds  Hematology negative hematology ROS (+)   Anesthesia Other Findings   Reproductive/Obstetrics negative OB ROS                             Anesthesia Physical Anesthesia Plan  ASA: 2  Anesthesia Plan: General and General ETT   Post-op Pain Management:    Induction:   PONV Risk Score and Plan: Ondansetron  Airway Management Planned:   Additional Equipment:   Intra-op Plan:   Post-operative Plan:   Informed Consent: I have reviewed the patients History and Physical, chart, labs and discussed the procedure including the risks, benefits and alternatives for the proposed anesthesia with the patient or authorized representative who has indicated his/her understanding and acceptance.     Dental Advisory Given  Plan Discussed with: CRNA  Anesthesia Plan Comments:         Anesthesia Quick Evaluation

## 2022-03-12 NOTE — Progress Notes (Signed)
Update note:  Spoke with the patient's daughter, Danton Clap, in the waiting room.  Explained that she tolerated the procedure well, and her gallbladder was able to be removed.  She had significant inflammatory changes, so I suspect that she will have a fair amount of pain postoperatively.  During intubation, she did have a small aspiration event.  She had no respiratory issues throughout the case.  She has dissolvable sutures under the skin, and overlying skin glue.  This will flake off in 10 to 14 days.  She will be given a diet and pain medications.  All questions were answered to her expressed satisfaction.  Plan: -Will put her in a stepdown bed overnight for close clinical monitoring given 300 cc EBL with possible aspiration event during intubation -Full liquid diet, may advance to regular diet as tolerated -Scheduled Tylenol, as needed Roxicodone and morphine -Bowel regimen with Senokot-S, MiraLAX, and Dulcolax suppositories ordered -AM labs -Appreciate hospitalist recommendations  Graciella Freer, DO [facility] [address] [place], [place] [address] [phone] (office)

## 2022-03-12 NOTE — Progress Notes (Signed)
PROGRESS NOTE       PROGRESS NOTE    Abigail Frazier  OXB:[id number] DOB: 20-May-1941 DOA: 03/09/2022 PCP: Iona Beard, MD    Chief Complaint  Patient presents with   Abdominal Pain    Brief Narrative:   Abigail Frazier is a 81 y.o. female with medical history significant of essential hypertension, hyperlipidemia, GERD who presents to the emergency department due to abdominal pain which started yesterday in the evening while watching TV.  Abdominal pain was in the periumbilical area and was associated with nausea and vomiting.     CT abdomen and pelvis with contrast showed: 1.  Cholelithiasis and findings concerning for acute cholecystitis.  No biliary dilatation 2. 9.1 x 7.8 x 9 cm heterogeneous splenic mass on the initial images, more homogeneous attenuation in the delayed phase. 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 MRI without and with contrast recommended to confirm a benign entity. 3. 2.9 x 1.8 cm heterogeneous right adrenal mass. Recommend biochemical lab evaluation for  pheochromocytoma Patient was empirically started on IV Zosyn, IV fentanyl was given.  Zofran was given.  Hospitalist was asked to admit patient for further evaluation and management. She underwent MRI of the abdomen, ruling out choledocholithiasis. HIDA scan is positive for acute cholecystitis.  Gen surgery onboard and she is scheduled for lap cholecystectomy today.   Assessment & Plan:   Principal Problem:   Abdominal pain Active Problems:   Nausea & vomiting   Hypokalemia   Hyperglycemia   Splenic mass   Right adrenal mass (HCC)   Essential hypertension   Mixed hyperlipidemia   GERD (gastroesophageal reflux disease)   Nausea, vomiting and abdominal pain secondary to acute cholecystitis:  Abdominal imaging showing acute cholecystitis.  MRI of the abdomen showing Cholelithiasis with pericholecystic edema, again concerning fo acute cholecystitis. No evidence of choledocholithiasis or biliary ductal  dilatation. HIDA scan is positive for acute cholecystitis.  General Surgery on board and plan for lap cholecystectomy today.  Meanwhile continue with IV antibiotics, IV fluids and pain control.     Leukocytosis  Improving.    Hypokalemia: replaced.    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 Malignancy or metastatic disease can not however be excluded on the basis of imaging. Consider short interval follow-up in 3-6 months to ensure stability.    Definitively benign,right adrenal adenoma, : No further follow up needed.  Plasma metanephrines are pending.    Hypertension:  BP parameters appear to be optimal.   GERD stable.   DVT prophylaxis: (SCd's) Code Status: Full code.  Family Communication: none at bedside.  Disposition:   Status is: Inpatient Remains inpatient appropriate because: Lap cholecystectomy.    Level of care: Med-Surg Consultants:  GENERAL SURGERY.   Procedures: MRI of the abdomen.  HIDA SCAN.   Antimicrobials: none.    Subjective: No nausea, vomiting or abdominal pain today.   Objective: Vitals:   03/11/22 1633 03/11/22 2046 03/12/22 0548 03/12/22 0831  BP: 106/64 (!) 94/53 (!) 99/53 109/61  Pulse: 87 83 64 65  Resp: 20     Temp: 98.1 F (36.7 C) 99.4 F (37.4 C) 98.3 F (36.8 C)   TempSrc: Oral Oral Oral   SpO2: 98% 95% 97% 97%  Weight:      Height:        Intake/Output Summary (Last 24 hours) at 03/12/2022 1102 Last data  filed at 03/12/2022 0548 Gross per 24 hour  Intake 2366.77 ml  Output 200 ml  Net 2166.77 ml    Filed Weights   03/09/22 1742  Weight: 74.8 kg    Examination:  General exam: Appears calm and comfortable  Respiratory system: Clear to auscultation. Respiratory effort normal. Cardiovascular  system: S1 & S2 heard, RRR. No JVD,  No pedal edema. Gastrointestinal system: Abdomen is nondistended, soft and nontender. Normal bowel sounds heard. Central nervous system: Alert and oriented. No focal neurological deficits. Extremities: Symmetric 5 x 5 power. Skin: No rashes, lesions or ulcers Psychiatry: Mood & affect appropriate.      Data Reviewed: I have personally reviewed following labs and imaging studies  CBC: Recent Labs  Lab 03/09/22 1759 03/10/22 0542 03/11/22 0909 03/12/22 0404  WBC 17.4* 20.3* 17.8* 14.9*  HGB 14.0 13.9 12.5 11.3*  HCT 42.7 41.0 37.2 34.3*  MCV 85.9 85.4 84.7 86.6  PLT 255 232 218 194     Basic Metabolic Panel: Recent Labs  Lab 03/09/22 1759 03/10/22 0542 03/11/22 0909 03/12/22 0404  NA 139 137 137 138  K 3.1* 3.8 3.3* 3.5  CL 105 102 102 104  CO2 '23 27 27 27  '$ GLUCOSE 185* 125* 103* 82  BUN '12 9 12 16  '$ CREATININE 0.86 0.64 0.93 1.02*  CALCIUM 8.8* 8.9 8.3* 7.9*  MG  --  1.8  --   --   PHOS  --  2.3*  --   --      GFR: Estimated Creatinine Clearance: 43.2 mL/min (A) (by C-G formula based on SCr of 1.02 mg/dL (H)).  Liver Function Tests: Recent Labs  Lab 03/09/22 1759 03/10/22 0542 03/11/22 0909 03/12/22 0404  AST 14* '15 20 18  '$ ALT '11 11 14 18  '$ ALKPHOS 69 71 62 72  BILITOT 1.6* 2.0* 3.9* 3.6*  PROT 7.6 7.5 6.6 5.9*  ALBUMIN 4.6 4.3 3.4* 3.1*     CBG: No results for input(s): "GLUCAP" in the last 168 hours.   No results found for this or any previous visit (from the past 240 hour(s)).       Radiology Studies: NM Hepato W/EF  Result Date: 03/11/2022 CLINICAL DATA:  Abdominal pain, cholelithiasis, question acute cholecystitis EXAM: NUCLEAR MEDICINE HEPATOBILIARY IMAGING TECHNIQUE: Sequential images of the abdomen were obtained out to 60 minutes following intravenous administration of radiopharmaceutical. RADIOPHARMACEUTICALS:  6.7 mCi Tc-20mCholetec IV (5.5 mCi + 1.2 mCi) COMPARISON:  CT abdomen and pelvis and  ultrasound abdomen 03/10/2022 FINDINGS: Normal tracer extraction from bloodstream indicating normal hepatocellular function. Prompt excretion of tracer into biliary tree. Small bowel visualized by 19 minutes. At 1 hour, gallbladder had not visualized. Patient was reinjected with additional tracer and 3 mg of morphine IV. Imaging for 30 minutes fails to demonstrate tracer within the gallbladder. Findings are consistent with cystic duct obstruction and acute cholecystitis. IMPRESSION: Nonvisualization of the gallbladder despite morphine augmentation consistent with acute cholecystitis. Patent CBD. Electronically Signed   By: MLavonia DanaM.D.   On: 03/11/2022 13:48   MR ABDOMEN W WO CONTRAST  Result Date: 03/11/2022 CLINICAL DATA:  Splenic mass identified by CT EXAM: MRI ABDOMEN WITHOUT AND WITH CONTRAST TECHNIQUE: Multiplanar multisequence MR imaging of the abdomen was performed both before and after the administration of intravenous contrast. CONTRAST:  734mGADAVIST GADOBUTROL 1 MMOL/ML IV SOLN COMPARISON:  CT abdomen pelvis, 03/10/2022 FINDINGS: Lower chest: No acute abnormality. Hepatobiliary: No solid liver abnormality is seen. Numerous tiny gallstones in the dependent  gallbladder (series 4, image 24). Similar pericholecystic edema (series 3, image 16). No biliary ductal dilatation. No gallstones identified to the ampulla. Pancreas: Unremarkable. No pancreatic ductal dilatation or surrounding inflammatory changes. Spleen: Spleen is enlarged by the presence of a mass, maximum coronal span 15.5 cm. Rounded, faintly circumscribed mass occupying essentially the entirety of the superior spleen is nearly isosignal to splenic parenchyma, minimally T2 hyperintense, with heterogeneous hypoenhancement to splenic parenchyma on multiphasic contrast enhanced sequences. This lesion measures 10.0 x 8.5 x 9.7 cm (series 4, image 16, series 3, image 22). Adrenals/Urinary Tract: Definitively benign, macroscopic fat containing  right adrenal adenoma, for which no further follow-up or characterization is required (series 12, image 25). Normal left adrenal gland. Kidneys are normal, without renal calculi, solid lesion, or hydronephrosis. Stomach/Bowel: Stomach is within normal limits. No evidence of bowel wall thickening, distention, or inflammatory changes. Vascular/Lymphatic: No significant vascular findings are present. No enlarged abdominal lymph nodes. Other: No abdominal wall hernia or abnormality. No ascites. Musculoskeletal: No acute or significant osseous findings. IMPRESSION: 1. Cholelithiasis with pericholecystic edema, again concerning for acute cholecystitis. No evidence of choledocholithiasis or biliary ductal dilatation. 2. Rounded, faintly circumscribed mass occupying essentially the entirety of the superior spleen, measuring 10.0 x 8.5 x 9.7 cm, with heterogeneous hypoenhancement to splenic parenchyma on multiphasic contrast enhanced sequences. 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 Malignancy or metastatic disease can not however be excluded on the basis of imaging. Consider short interval follow-up in 3-6 months to ensure stability. 3. Definitively benign, macroscopic fat containing right adrenal adenoma, for which no further follow-up or characterization is required. Electronically Signed   By: Delanna Ahmadi M.D.   On: 03/11/2022 12:05   MR 3D Recon At Scanner  Result Date: 03/11/2022 CLINICAL DATA:  Splenic mass identified by CT EXAM: MRI ABDOMEN WITHOUT AND WITH CONTRAST TECHNIQUE: Multiplanar multisequence MR imaging of the abdomen was performed both before and after the administration of intravenous contrast. CONTRAST:  16m GADAVIST GADOBUTROL 1 MMOL/ML IV SOLN COMPARISON:  CT abdomen pelvis, 03/10/2022 FINDINGS: Lower chest: No acute abnormality. Hepatobiliary: No solid liver abnormality is seen. Numerous tiny gallstones  in the dependent gallbladder (series 4, image 24). Similar pericholecystic edema (series 3, image 16). No biliary ductal dilatation. No gallstones identified to the ampulla. Pancreas: Unremarkable. No pancreatic ductal dilatation or surrounding inflammatory changes. Spleen: Spleen is enlarged by the presence of a mass, maximum coronal span 15.5 cm. Rounded, faintly circumscribed mass occupying essentially the entirety of the superior spleen is nearly isosignal to splenic parenchyma, minimally T2 hyperintense, with heterogeneous hypoenhancement to splenic parenchyma on multiphasic contrast enhanced sequences. This lesion measures 10.0 x 8.5 x 9.7 cm (series 4, image 16, series 3, image 22). Adrenals/Urinary Tract: Definitively benign, macroscopic fat containing right adrenal adenoma, for which no further follow-up or characterization is required (series 12, image 25). Normal left adrenal gland. Kidneys are normal, without renal calculi, solid lesion, or hydronephrosis. Stomach/Bowel: Stomach is within normal limits. No evidence of bowel wall thickening, distention, or inflammatory changes. Vascular/Lymphatic: No significant vascular findings are present. No enlarged abdominal lymph nodes. Other: No abdominal wall hernia or abnormality. No ascites. Musculoskeletal: No acute or significant osseous findings. IMPRESSION: 1. Cholelithiasis with pericholecystic edema, again concerning for acute cholecystitis. No evidence of choledocholithiasis or biliary ductal dilatation. 2. Rounded, faintly circumscribed mass occupying essentially the entirety of the superior spleen, measuring 10.0 x 8.5 x 9.7 cm, with  heterogeneous hypoenhancement to splenic parenchyma on multiphasic contrast enhanced sequences. 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 Malignancy or metastatic disease can not however be excluded on the basis of imaging. Consider  short interval follow-up in 3-6 months to ensure stability. 3. Definitively benign, macroscopic fat containing right adrenal adenoma, for which no further follow-up or characterization is required. Electronically Signed   By: Delanna Ahmadi M.D.   On: 03/11/2022 12:05        Scheduled Meds:  [MAR Hold] pantoprazole (PROTONIX) IV  40 mg Intravenous Q24H   Continuous Infusions:  lactated ringers 75 mL/hr at 03/11/22 1843   [MAR Hold] piperacillin-tazobactam (ZOSYN)  IV 3.375 g (03/12/22 0454)     LOS: 2 days    Time spent: 35 minutes.     Hosie Poisson, MD [facility]

## 2022-03-12 NOTE — Transfer of Care (Signed)
Immediate Anesthesia Transfer of Care Note  Patient: Abigail Frazier  Procedure(s) Performed: LAPAROSCOPIC CHOLECYSTECTOMY (Abdomen) LYSIS OF ADHESIONS (Abdomen)  Patient Location: PACU  Anesthesia Type:General  Level of Consciousness: awake  Airway & Oxygen Therapy: Patient Spontanous Breathing  Post-op Assessment: Report given to RN  Post vital signs: Reviewed and stable  Last Vitals:  Vitals Value Taken Time  BP 122/62 03/12/22 1531  Temp    Pulse 81 03/12/22 1537  Resp 18 03/12/22 1537  SpO2 100 % 03/12/22 1537  Vitals shown include unvalidated device data.  Last Pain:  Vitals:   03/12/22 1107  TempSrc:   PainSc: 0-No pain         Complications: No notable events documented.

## 2022-03-13 DIAGNOSIS — R1033 Periumbilical pain: Secondary | ICD-10-CM | POA: Diagnosis not present

## 2022-03-13 DIAGNOSIS — I10 Essential (primary) hypertension: Secondary | ICD-10-CM | POA: Diagnosis not present

## 2022-03-13 DIAGNOSIS — R739 Hyperglycemia, unspecified: Secondary | ICD-10-CM | POA: Diagnosis not present

## 2022-03-13 DIAGNOSIS — K219 Gastro-esophageal reflux disease without esophagitis: Secondary | ICD-10-CM | POA: Diagnosis not present

## 2022-03-13 LAB — COMPREHENSIVE METABOLIC PANEL
ALT: 36 U/L (ref 0–44)
AST: 46 U/L — ABNORMAL HIGH (ref 15–41)
Albumin: 2.9 g/dL — ABNORMAL LOW (ref 3.5–5.0)
Alkaline Phosphatase: 72 U/L (ref 38–126)
Anion gap: 7 (ref 5–15)
BUN: 13 mg/dL (ref 8–23)
CO2: 25 mmol/L (ref 22–32)
Calcium: 7.6 mg/dL — ABNORMAL LOW (ref 8.9–10.3)
Chloride: 106 mmol/L (ref 98–111)
Creatinine, Ser: 0.89 mg/dL (ref 0.44–1.00)
GFR, Estimated: >60 mL/min (ref >60)
Glucose, Bld: 120 mg/dL — ABNORMAL HIGH (ref 70–99)
Potassium: 3.7 mmol/L (ref 3.5–5.1)
Sodium: 138 mmol/L (ref 135–145)
Total Bilirubin: 2.9 mg/dL — ABNORMAL HIGH (ref 0.3–1.2)
Total Protein: 5.8 g/dL — ABNORMAL LOW (ref 6.5–8.1)

## 2022-03-13 LAB — CBC
HCT: 32.9 % — ABNORMAL LOW (ref 36.0–46.0)
Hemoglobin: 10.8 g/dL — ABNORMAL LOW (ref 12.0–15.0)
MCH: 28.4 pg (ref 26.0–34.0)
MCHC: 32.8 g/dL (ref 30.0–36.0)
MCV: 86.6 fL (ref 80.0–100.0)
Platelets: 206 10*3/uL (ref 150–400)
RBC: 3.8 MIL/uL — ABNORMAL LOW (ref 3.87–5.11)
RDW: 15.8 % — ABNORMAL HIGH (ref 11.5–15.5)
WBC: 16.2 10*3/uL — ABNORMAL HIGH (ref 4.0–10.5)
nRBC: 0 % (ref 0.0–0.2)

## 2022-03-13 LAB — MRSA NEXT GEN BY PCR, NASAL: MRSA by PCR Next Gen: NOT DETECTED

## 2022-03-13 NOTE — Progress Notes (Signed)
PROGRESS NOTE    Abigail Frazier  YQM:[id number] DOB: 08/26/1940 DOA: 03/09/2022 PCP: Iona Beard, MD    Chief Complaint  Patient presents with   Abdominal Pain    Brief Narrative:   Abigail Frazier is a 81 y.o. female with medical history significant of essential hypertension, hyperlipidemia, GERD who presents to the emergency department due to abdominal pain which started yesterday in the evening while watching TV.  Abdominal pain was in the periumbilical area and was associated with nausea and vomiting.     CT abdomen and pelvis with contrast showed: 1.  Cholelithiasis and findings concerning for acute cholecystitis.  No biliary dilatation 2. 9.1 x 7.8 x 9 cm heterogeneous splenic mass on the initial images, more homogeneous attenuation in the delayed phase. 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 MRI without and with contrast recommended to confirm a benign entity.  3. 2.9 x 1.8 cm heterogeneous right adrenal mass. Recommend biochemical lab evaluation for  pheochromocytoma.  Patient was empirically started on IV Zosyn, IV fentanyl was given.  Zofran was given.  Hospitalist was asked to admit patient for further evaluation and management.  She underwent MRI of the abdomen, ruling out choledocholithiasis. HIDA scan is positive for acute cholecystitis.   Gen surgery onboard and she is scheduled for lap cholecystectomy today.   Assessment & Plan:   Principal Problem:   Abdominal pain Active Problems:   Nausea & vomiting   Hypokalemia   Hyperglycemia   Splenic mass   Right adrenal mass (HCC)   Essential hypertension   Mixed hyperlipidemia   GERD (gastroesophageal reflux disease)   Calculus of gallbladder with acute cholecystitis without obstruction   Nausea, vomiting and abdominal pain secondary to acute cholecystitis:  POD#1 s/p lap chole Advance diet as tolerated  Can move out of SDU to telemetry  Abdominal imaging showing acute cholecystitis.  MRI of the abdomen showing Cholelithiasis  with pericholecystic edema, again concerning fo acute cholecystitis. No evidence of choledocholithiasis or biliary ductal dilatation. HIDA scan is positive for acute cholecystitis.  General Surgery following   Reactive Leukocytosis  Recheck CBC in AM with diff   Hypokalemia: replaced.  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 Malignancy or metastatic disease can not however be excluded on the basis of imaging. Consider short interval follow-up in 3-6 months to ensure stability.  Definitively benign,right adrenal adenoma, : No further follow up needed.  Plasma metanephrines are pending.   Hypertension:  BP controlled    GERD stable.   DVT prophylaxis: (SCDs) Code Status: Full code.  Family Communication: none at bedside.  Disposition:   Status is: Inpatient Remains inpatient appropriate because: Lap cholecystectomy.    Level of care: Telemetry Consultants:  GENERAL SURGERY.   Procedures: MRI of the abdomen.  HIDA SCAN.   Antimicrobials: none.    Subjective: Pt agreeable to advancing diet, no SOB or cough symptoms   Objective: Vitals:   03/13/22 0800 03/13/22 0900 03/13/22 1000 03/13/22 1122  BP: (!) 127/57 119/61 111/61   Pulse: 78 66 80 69  Resp: (!) 21 (!) 23 (!) 25 (!) 26  Temp:    98.8 F (37.1 C)  TempSrc:    Oral  SpO2: 94% 91% 92% 93%  Weight:      Height:        Intake/Output Summary (Last 24 hours) at 03/13/2022 1205 Last data  filed at 03/13/2022 0900 Gross per 24 hour  Intake 3029.48 ml  Output 550 ml  Net 2479.48 ml   Filed Weights   03/09/22 1742 03/12/22 1107 03/13/22 0500  Weight: 74.8 kg 74 kg 76.3 kg    Examination:  General exam: awake, alert, cooperative, Appears calm and comfortable  Respiratory system: BBS CTA.  No  increased work of breathing.  Cardiovascular system: S1 & S2 heard, RRR. No JVD,  No pedal edema. Gastrointestinal system: Abdomen is nondistended, soft and nontender. Normal bowel sounds heard. Central nervous system: Alert and oriented. No focal neurological deficits. Extremities: Symmetric 5 x 5 power. Skin: No rashes, lesions or ulcers Psychiatry: Mood & affect appropriate.   Data Reviewed: I have personally reviewed following labs and imaging studies  CBC: Recent Labs  Lab 03/09/22 1759 03/10/22 0542 03/11/22 0909 03/12/22 0404 03/13/22 0305  WBC 17.4* 20.3* 17.8* 14.9* 16.2*  HGB 14.0 13.9 12.5 11.3* 10.8*  HCT 42.7 41.0 37.2 34.3* 32.9*  MCV 85.9 85.4 84.7 86.6 86.6  PLT 255 232 218 194 580    Basic Metabolic Panel: Recent Labs  Lab 03/09/22 1759 03/10/22 0542 03/11/22 0909 03/12/22 0404 03/13/22 0305  NA 139 137 137 138 138  K 3.1* 3.8 3.3* 3.5 3.7  CL 105 102 102 104 106  CO2 '23 27 27 27 25  '$ GLUCOSE 185* 125* 103* 82 120*  BUN '12 9 12 16 13  '$ CREATININE 0.86 0.64 0.93 1.02* 0.89  CALCIUM 8.8* 8.9 8.3* 7.9* 7.6*  MG  --  1.8  --   --   --   PHOS  --  2.3*  --   --   --     GFR: Estimated Creatinine Clearance: 50 mL/min (by C-G formula based on SCr of 0.89 mg/dL).  Liver Function Tests: Recent Labs  Lab 03/09/22 1759 03/10/22 0542 03/11/22 0909 03/12/22 0404 03/13/22 0305  AST 14* '15 20 18 '$ 46*  ALT '11 11 14 18 '$ 36  ALKPHOS 69 71 62 72 72  BILITOT 1.6* 2.0* 3.9* 3.6* 2.9*  PROT 7.6 7.5 6.6 5.9* 5.8*  ALBUMIN 4.6 4.3 3.4* 3.1* 2.9*    CBG: Recent Labs  Lab 03/12/22 1529  GLUCAP 145*    No results found for this or any previous visit (from the past 240 hour(s)).    Radiology Studies: DG Chest 1 View  Result Date: 03/12/2022 CLINICAL DATA:  Aspiration EXAM: CHEST  1 VIEW COMPARISON:  03/01/2018 FINDINGS: The heart size and mediastinal contours are within normal limits. Aortic atherosclerosis. Low lung volumes. No focal airspace  consolidation, pleural effusion, or pneumothorax. The visualized skeletal structures are unremarkable. IMPRESSION: No active disease. Electronically Signed   By: Davina Poke D.O.   On: 03/12/2022 16:08   NM Hepato W/EF  Result Date: 03/11/2022 CLINICAL DATA:  Abdominal pain, cholelithiasis, question acute cholecystitis EXAM: NUCLEAR MEDICINE HEPATOBILIARY IMAGING TECHNIQUE: Sequential images of the abdomen were obtained out to 60 minutes following intravenous administration of radiopharmaceutical. RADIOPHARMACEUTICALS:  6.7 mCi Tc-82mCholetec IV (5.5 mCi + 1.2 mCi) COMPARISON:  CT abdomen and pelvis and ultrasound abdomen 03/10/2022 FINDINGS: Normal tracer extraction from bloodstream indicating normal hepatocellular function. Prompt excretion of tracer into biliary tree. Small bowel visualized by 19 minutes. At 1 hour, gallbladder had not visualized. Patient was reinjected with additional tracer and 3 mg of morphine IV. Imaging for 30 minutes fails to demonstrate tracer within the gallbladder. Findings are consistent with cystic duct obstruction and acute cholecystitis. IMPRESSION: Nonvisualization of the  gallbladder despite morphine augmentation consistent with acute cholecystitis. Patent CBD. Electronically Signed   By: Lavonia Dana M.D.   On: 03/11/2022 13:48     Scheduled Meds:  acetaminophen  1,000 mg Oral Q6H   bisacodyl  10 mg Rectal Daily   Chlorhexidine Gluconate Cloth  6 each Topical Daily   pantoprazole (PROTONIX) IV  40 mg Intravenous Q24H   polyethylene glycol  17 g Oral Daily   senna-docusate  1 tablet Oral BID   Continuous Infusions:  lactated ringers 75 mL/hr at 03/13/22 0900   piperacillin-tazobactam (ZOSYN)  IV 3.375 g (03/13/22 0536)    LOS: 3 days   Time spent: 35 minutes.   Irwin Brakeman, MD How to contact the [facility] Attending or Consulting provider [place] or covering provider during after hours [place], for this patient?  Check the care team in [facility] and look for a)  attending/consulting [facility] provider listed and b) the [facility] team listed Log into [email or web address] and use [place]'s universal password to access. If you do not have the password, please contact the hospital operator. Locate the [facility] provider you are looking for under [facility] and page to a number that you can be directly reached. If you still have difficulty reaching the provider, please page the [facility] (Director on Call) for the Hospitalists listed on amion for assistance.

## 2022-03-13 NOTE — Care Management Important Message (Signed)
Important Message  Patient Details  Name: Abigail Frazier MRN: [id number] Date of Birth: 04/28/1941   Medicare Important Message Given:  Yes     Tommy Medal 03/13/2022, 11:23 AM

## 2022-03-13 NOTE — Progress Notes (Signed)
[facility] Progress Note  1 Day Post-Op  Subjective: Patient seen and examined.  She is resting comfortably in bed.  She did not eat much last night.  Her pain is well controlled.  She denies nausea and vomiting.  She had a bowel movement overnight.  She denies any shortness of breath at this time.  Objective: Vital signs in last 24 hours: Temp:  [98.3 F (36.8 C)-98.6 F (37 C)] 98.5 F (36.9 C) (08/30 0736) Pulse Rate:  [66-84] 80 (08/30 1000) Resp:  [12-27] 25 (08/30 1000) BP: (104-151)/(57-86) 111/61 (08/30 1000) SpO2:  [91 %-100 %] 92 % (08/30 1000) Weight:  [76.3 kg] 76.3 kg (08/30 0500) Last BM Date : 03/07/22  Intake/Output from previous day: 08/29 0701 - 08/30 0700 In: 2607 [P.O.:50; I.V.:2557] Out: 550 [Urine:250; Blood:300] Intake/Output this shift: Total I/O In: 422.4 [I.V.:422.4] Out: -   General appearance: alert, cooperative, and no distress GI: Abdomen soft, nondistended, no percussion tenderness, minimal incisional TTP; no rigidity, guarding or rebound tenderness; Incisions C/D/I with dermabond in place  Lab Results:  Recent Labs    03/12/22 0404 03/13/22 0305  WBC 14.9* 16.2*  HGB 11.3* 10.8*  HCT 34.3* 32.9*  PLT 194 206   BMET Recent Labs    03/12/22 0404 03/13/22 0305  NA 138 138  K 3.5 3.7  CL 104 106  CO2 27 25  GLUCOSE 82 120*  BUN 16 13  CREATININE 1.02* 0.89  CALCIUM 7.9* 7.6*   PT/INR No results for input(s): "LABPROT", "INR" in the last 72 hours.  Studies/Results: DG Chest 1 View  Result Date: 03/12/2022 CLINICAL DATA:  Aspiration EXAM: CHEST  1 VIEW COMPARISON:  03/01/2018 FINDINGS: The heart size and mediastinal contours are within normal limits. Aortic atherosclerosis. Low lung volumes. No focal airspace consolidation, pleural effusion, or pneumothorax. The visualized skeletal structures are unremarkable. IMPRESSION: No active disease. Electronically Signed   By: Davina Poke D.O.   On: 03/12/2022  16:08   NM Hepato W/EF  Result Date: 03/11/2022 CLINICAL DATA:  Abdominal pain, cholelithiasis, question acute cholecystitis EXAM: NUCLEAR MEDICINE HEPATOBILIARY IMAGING TECHNIQUE: Sequential images of the abdomen were obtained out to 60 minutes following intravenous administration of radiopharmaceutical. RADIOPHARMACEUTICALS:  6.7 mCi Tc-45mCholetec IV (5.5 mCi + 1.2 mCi) COMPARISON:  CT abdomen and pelvis and ultrasound abdomen 03/10/2022 FINDINGS: Normal tracer extraction from bloodstream indicating normal hepatocellular function. Prompt excretion of tracer into biliary tree. Small bowel visualized by 19 minutes. At 1 hour, gallbladder had not visualized. Patient was reinjected with additional tracer and 3 mg of morphine IV. Imaging for 30 minutes fails to demonstrate tracer within the gallbladder. Findings are consistent with cystic duct obstruction and acute cholecystitis. IMPRESSION: Nonvisualization of the gallbladder despite morphine augmentation consistent with acute cholecystitis. Patent CBD. Electronically Signed   By: MLavonia DanaM.D.   On: 03/11/2022 13:48   MR ABDOMEN W WO CONTRAST  Result Date: 03/11/2022 CLINICAL DATA:  Splenic mass identified by CT EXAM: MRI ABDOMEN WITHOUT AND WITH CONTRAST TECHNIQUE: Multiplanar multisequence MR imaging of the abdomen was performed both before and after the administration of intravenous contrast. CONTRAST:  732mGADAVIST GADOBUTROL 1 MMOL/ML IV SOLN COMPARISON:  CT abdomen pelvis, 03/10/2022 FINDINGS: Lower chest: No acute abnormality. Hepatobiliary: No solid liver abnormality is seen. Numerous tiny gallstones in the dependent gallbladder (series 4, image 24). Similar pericholecystic edema (series 3, image 16). No biliary ductal dilatation. No gallstones identified to the ampulla. Pancreas: Unremarkable. No pancreatic ductal dilatation or  surrounding inflammatory changes. Spleen: Spleen is enlarged by the presence of a mass, maximum coronal span 15.5 cm.  Rounded, faintly circumscribed mass occupying essentially the entirety of the superior spleen is nearly isosignal to splenic parenchyma, minimally T2 hyperintense, with heterogeneous hypoenhancement to splenic parenchyma on multiphasic contrast enhanced sequences. This lesion measures 10.0 x 8.5 x 9.7 cm (series 4, image 16, series 3, image 22). Adrenals/Urinary Tract: Definitively benign, macroscopic fat containing right adrenal adenoma, for which no further follow-up or characterization is required (series 12, image 25). Normal left adrenal gland. Kidneys are normal, without renal calculi, solid lesion, or hydronephrosis. Stomach/Bowel: Stomach is within normal limits. No evidence of bowel wall thickening, distention, or inflammatory changes. Vascular/Lymphatic: No significant vascular findings are present. No enlarged abdominal lymph nodes. Other: No abdominal wall hernia or abnormality. No ascites. Musculoskeletal: No acute or significant osseous findings. IMPRESSION: 1. Cholelithiasis with pericholecystic edema, again concerning for acute cholecystitis. No evidence of choledocholithiasis or biliary ductal dilatation. 2. Rounded, faintly circumscribed mass occupying essentially the entirety of the superior spleen, measuring 10.0 x 8.5 x 9.7 cm, with heterogeneous hypoenhancement to splenic parenchyma on multiphasic contrast enhanced sequences. 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 Malignancy or metastatic disease can not however be excluded on the basis of imaging. Consider short interval follow-up in 3-6 months to ensure stability. 3. Definitively benign, macroscopic fat containing right adrenal adenoma, for which no further follow-up or characterization is required. Electronically Signed   By: Delanna Ahmadi M.D.   On: 03/11/2022 12:05   MR 3D Recon At Scanner  Result Date: 03/11/2022 CLINICAL DATA:  Splenic mass  identified by CT EXAM: MRI ABDOMEN WITHOUT AND WITH CONTRAST TECHNIQUE: Multiplanar multisequence MR imaging of the abdomen was performed both before and after the administration of intravenous contrast. CONTRAST:  38m GADAVIST GADOBUTROL 1 MMOL/ML IV SOLN COMPARISON:  CT abdomen pelvis, 03/10/2022 FINDINGS: Lower chest: No acute abnormality. Hepatobiliary: No solid liver abnormality is seen. Numerous tiny gallstones in the dependent gallbladder (series 4, image 24). Similar pericholecystic edema (series 3, image 16). No biliary ductal dilatation. No gallstones identified to the ampulla. Pancreas: Unremarkable. No pancreatic ductal dilatation or surrounding inflammatory changes. Spleen: Spleen is enlarged by the presence of a mass, maximum coronal span 15.5 cm. Rounded, faintly circumscribed mass occupying essentially the entirety of the superior spleen is nearly isosignal to splenic parenchyma, minimally T2 hyperintense, with heterogeneous hypoenhancement to splenic parenchyma on multiphasic contrast enhanced sequences. This lesion measures 10.0 x 8.5 x 9.7 cm (series 4, image 16, series 3, image 22). Adrenals/Urinary Tract: Definitively benign, macroscopic fat containing right adrenal adenoma, for which no further follow-up or characterization is required (series 12, image 25). Normal left adrenal gland. Kidneys are normal, without renal calculi, solid lesion, or hydronephrosis. Stomach/Bowel: Stomach is within normal limits. No evidence of bowel wall thickening, distention, or inflammatory changes. Vascular/Lymphatic: No significant vascular findings are present. No enlarged abdominal lymph nodes. Other: No abdominal wall hernia or abnormality. No ascites. Musculoskeletal: No acute or significant osseous findings. IMPRESSION: 1. Cholelithiasis with pericholecystic edema, again concerning for acute cholecystitis. No evidence of choledocholithiasis or biliary ductal dilatation. 2. Rounded, faintly circumscribed  mass occupying essentially the entirety of the superior spleen, measuring 10.0 x 8.5 x 9.7 cm, with heterogeneous hypoenhancement to splenic parenchyma on multiphasic contrast enhanced sequences. 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 Malignancy or metastatic disease can not however be excluded on the basis of imaging. Consider short interval follow-up in 3-6 months to ensure stability. 3. Definitively benign, macroscopic fat containing right adrenal adenoma, for which no further follow-up or characterization is required. Electronically Signed   By: Delanna Ahmadi M.D.   On: 03/11/2022 12:05    Anti-infectives: Anti-infectives (From admission, onward)    Start     Dose/Rate Route Frequency Ordered Stop   03/10/22 1000  piperacillin-tazobactam (ZOSYN) IVPB 3.375 g        3.375 g 12.5 mL/hr over 240 Minutes Intravenous Every 8 hours 03/10/22 0541     03/10/22 0200  piperacillin-tazobactam (ZOSYN) IVPB 3.375 g        3.375 g 100 mL/hr over 30 Minutes Intravenous  Once 03/10/22 0148 03/10/22 0256       Assessment/Plan:  Patient is an 81 year old female who was admitted with concern for acute cholecystitis.  Status post laparoscopic cholecystectomy with lysis of adhesions on 8/29  -Mild bump in leukocytosis today (16.2 from 14.9), likely reactive in nature -Hemoglobin stable at 10.8, will continue to monitor given 300 cc of EBL -Continue IV Zosyn while inpatient -Total bilirubin downtrending, 2.9 from 3.6 -Patient to attempt full liquid diet today, may advance as tolerated to a regular diet -Scheduled Tylenol, as needed Roxicodone morphine -Continue bowel regimen with Senokot-S, MiraLAX, and Dulcolax suppositories -Patient denying any shortness of breath, recommend clinical monitoring given small aspiration event during intubation -Okay for transfer to regular floor from stepdown from a surgical  standpoint -Appreciate hospitalist recommendations   LOS: 3 days    [place] 03/13/2022

## 2022-03-14 DIAGNOSIS — I10 Essential (primary) hypertension: Secondary | ICD-10-CM | POA: Diagnosis not present

## 2022-03-14 DIAGNOSIS — K8000 Calculus of gallbladder with acute cholecystitis without obstruction: Secondary | ICD-10-CM | POA: Diagnosis not present

## 2022-03-14 DIAGNOSIS — K219 Gastro-esophageal reflux disease without esophagitis: Secondary | ICD-10-CM | POA: Diagnosis not present

## 2022-03-14 DIAGNOSIS — R1033 Periumbilical pain: Secondary | ICD-10-CM | POA: Diagnosis not present

## 2022-03-14 LAB — COMPREHENSIVE METABOLIC PANEL
ALT: 24 U/L (ref 0–44)
AST: 18 U/L (ref 15–41)
Albumin: 2.6 g/dL — ABNORMAL LOW (ref 3.5–5.0)
Alkaline Phosphatase: 57 U/L (ref 38–126)
Anion gap: 7 (ref 5–15)
BUN: 13 mg/dL (ref 8–23)
CO2: 26 mmol/L (ref 22–32)
Calcium: 7.5 mg/dL — ABNORMAL LOW (ref 8.9–10.3)
Chloride: 105 mmol/L (ref 98–111)
Creatinine, Ser: 0.99 mg/dL (ref 0.44–1.00)
GFR, Estimated: 57 mL/min — ABNORMAL LOW (ref >60)
Glucose, Bld: 88 mg/dL (ref 70–99)
Potassium: 3.4 mmol/L — ABNORMAL LOW (ref 3.5–5.1)
Sodium: 138 mmol/L (ref 135–145)
Total Bilirubin: 1.8 mg/dL — ABNORMAL HIGH (ref 0.3–1.2)
Total Protein: 5.2 g/dL — ABNORMAL LOW (ref 6.5–8.1)

## 2022-03-14 LAB — CBC
HCT: 26.9 % — ABNORMAL LOW (ref 36.0–46.0)
Hemoglobin: 8.8 g/dL — ABNORMAL LOW (ref 12.0–15.0)
MCH: 28.7 pg (ref 26.0–34.0)
MCHC: 32.7 g/dL (ref 30.0–36.0)
MCV: 87.6 fL (ref 80.0–100.0)
Platelets: 180 10*3/uL (ref 150–400)
RBC: 3.07 MIL/uL — ABNORMAL LOW (ref 3.87–5.11)
RDW: 15.9 % — ABNORMAL HIGH (ref 11.5–15.5)
WBC: 10.8 10*3/uL — ABNORMAL HIGH (ref 4.0–10.5)
nRBC: 0 % (ref 0.0–0.2)

## 2022-03-14 LAB — METANEPHRINES, PLASMA
Metanephrine, Free: 55.9 pg/mL (ref 0.0–88.0)
Normetanephrine, Free: 247.1 pg/mL (ref 0.0–297.2)

## 2022-03-14 LAB — SURGICAL PATHOLOGY

## 2022-03-14 MED ORDER — ACETAMINOPHEN 325 MG PO TABS: 650.0000 mg | ORAL_TABLET | Freq: Four times a day (QID) | ORAL | Status: AC

## 2022-03-14 MED ORDER — POTASSIUM CHLORIDE CRYS ER 20 MEQ PO TBCR
20.0000 meq | EXTENDED_RELEASE_TABLET | Freq: Once | ORAL | Status: AC
  Administered 2022-03-14: 20 meq via ORAL
  Filled 2022-03-14: qty 1

## 2022-03-14 MED ORDER — OXYCODONE HCL 5 MG PO TABS: 5.0000 mg | ORAL_TABLET | Freq: Four times a day (QID) | ORAL | 0 refills | Status: AC | PRN

## 2022-03-14 MED ORDER — SENNOSIDES-DOCUSATE SODIUM 8.6-50 MG PO TABS: 1.0000 | ORAL_TABLET | Freq: Two times a day (BID) | ORAL | 0 refills | Status: AC

## 2022-03-14 NOTE — Anesthesia Postprocedure Evaluation (Signed)
Anesthesia Post Note  Patient: Chemical engineer  Procedure(s) Performed: LAPAROSCOPIC CHOLECYSTECTOMY (Abdomen) LYSIS OF ADHESIONS (Abdomen)  Patient location during evaluation: Phase II Anesthesia Type: General Level of consciousness: awake Pain management: pain level controlled Vital Signs Assessment: post-procedure vital signs reviewed and stable Respiratory status: spontaneous breathing and respiratory function stable Cardiovascular status: blood pressure returned to baseline and stable Postop Assessment: no headache and no apparent nausea or vomiting Anesthetic complications: no Comments: Late entry   No notable events documented.   Last Vitals:  Vitals:   03/14/22 0824 03/14/22 0900  BP:  114/62  Pulse: (!) 59 65  Resp: 19 18  Temp: 36.9 C   SpO2: 98% 96%    Last Pain:  Vitals:   03/14/22 0900  TempSrc:   PainSc: 0-No pain                 Louann Sjogren

## 2022-03-14 NOTE — Discharge Summary (Addendum)
Physician Discharge Summary  [name] QAS:[id number] DOB: 1941-03-25 DOA: 03/09/2022  PCP: Iona Beard, MD Surgery: Dr. Okey Dupre  Admit date: 03/09/2022 Discharge date: 03/14/2022  Admitted From:  Home  Disposition: Home   Recommendations for Outpatient Follow-up:  Follow up with surgery in 2 weeks for check up Please consider short interval follow-up of splenic mass/lesion in 3-6 months to ensure stability. Please follow up on plasma metanephrines (still pending) Please check CBC in 1-2 weeks to follow up hemoglobin Please follow up with PCP in 1-2 weeks  Discharge Condition: STABLE   CODE STATUS: FULL DIET: soft diet x 1 week, then resume prior home diet    Brief Hospitalization Summary: Please see all hospital notes, images, labs for full details of the hospitalization. ADMISSION HPI:  Brief Narrative:    Abigail Frazier is a 81 y.o. female with medical history significant of essential hypertension, hyperlipidemia, GERD who presents to the emergency department due to abdominal pain which started yesterday in the evening while watching TV.  Abdominal pain was in the periumbilical area and was associated with nausea and vomiting.     CT abdomen and pelvis with contrast showed: 1.  Cholelithiasis and findings concerning for acute cholecystitis.  No biliary dilatation 2. 9.1 x 7.8 x 9 cm heterogeneous splenic mass on the initial images, more homogeneous attenuation in the delayed phase. 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 MRI without and with contrast recommended to confirm a benign entity.  3. 2.9 x 1.8 cm heterogeneous right adrenal mass. Recommend biochemical lab evaluation for  pheochromocytoma.  Patient was empirically started on IV Zosyn, IV fentanyl was given.  Zofran was given.  Hospitalist was asked to admit patient for further evaluation and management.  She underwent MRI of the abdomen, ruling out choledocholithiasis. HIDA scan is positive for acute cholecystitis.   Gen surgery  onboard and she is scheduled for lap cholecystectomy.   HOSPITAL COURSE BY PROBLEM   Nausea, vomiting and abdominal pain secondary to acute cholecystitis:  POD#2 s/p lap chole Advance diet as tolerated  Abdominal imaging showing acute cholecystitis.  MRI of the abdomen showing Cholelithiasis with pericholecystic edema, again concerning fo acute cholecystitis. No evidence of choledocholithiasis or biliary ductal dilatation. HIDA scan is positive for acute cholecystitis.  General Surgery following and patient cleared to discharge home today and can follow up with surgery in 2 weeks for recheck.    Anemia, postop - discussed with surgery, pt had approx 300 cc EBL from procedure and lots of IV fluid hydration which likely explains downtrending of Hg.   - recommend recheck CBC outpatient and further work up as needed. - please check CBC in 1-2 weeks on outpatient follow up     Reactive Leukocytosis  WBC trending down    Hypokalemia: replaced.   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 Malignancy or metastatic disease can not however be excluded on the basis of imaging. Consider short interval follow-up in 3-6 months to ensure stability.   Definitively benign,right adrenal adenoma, : No further follow up needed.  Plasma metanephrines are pending. Please follow up outpatient    Hypertension:  BP controlled     GERD stable.    Discharge Diagnoses:  Principal Problem:   Abdominal pain Active Problems:   Nausea & vomiting   Hypokalemia   Hyperglycemia   Splenic mass   Right adrenal  mass ([place])   Essential hypertension   Mixed hyperlipidemia   GERD (gastroesophageal reflux disease)   Calculus of gallbladder with acute cholecystitis without obstruction   Discharge  Instructions:  Allergies as of 03/14/2022   No Known Allergies      Medication List     STOP taking these medications    dexamethasone 4 MG tablet Commonly known as: DECADRON   HYDROcodone-homatropine 5-1.5 MG/5ML syrup Commonly known as: HYCODAN       TAKE these medications    acetaminophen 325 MG tablet Commonly known as: TYLENOL Take 2 tablets (650 mg total) by mouth every 6 (six) hours for 5 days.   amLODipine 5 MG tablet Commonly known as: NORVASC Take 1 tablet by mouth daily.   esomeprazole 40 MG capsule Commonly known as: NEXIUM Take 1 capsule by mouth daily.   oxyCODONE 5 MG immediate release tablet Commonly known as: Oxy IR/ROXICODONE Take 1 tablet (5 mg total) by mouth every 6 (six) hours as needed for up to 5 days for severe pain.   senna-docusate 8.6-50 MG tablet Commonly known as: Senokot-S Take 1 tablet by mouth 2 (two) times daily.        Follow-up Information     Pappayliou, Flint Melter, DO. Call.   Specialty: General Surgery Why: Call to schedule follow up appointment in 2 weeks Contact information: [address] [place] [facility] [address] [phone]                No Known Allergies Allergies as of 03/14/2022   No Known Allergies      Medication List     STOP taking these medications    dexamethasone 4 MG tablet Commonly known as: DECADRON   HYDROcodone-homatropine 5-1.5 MG/5ML syrup Commonly known as: HYCODAN       TAKE these medications    acetaminophen 325 MG tablet Commonly known as: TYLENOL Take 2 tablets (650 mg total) by mouth every 6 (six) hours for 5 days.   amLODipine 5 MG tablet Commonly known as: NORVASC Take 1 tablet by mouth daily.   esomeprazole 40 MG capsule Commonly known as: NEXIUM Take 1 capsule by mouth daily.   oxyCODONE 5 MG immediate release tablet Commonly known as: Oxy IR/ROXICODONE Take 1 tablet (5 mg total) by mouth every 6 (six) hours as needed for up to 5 days for severe  pain.   senna-docusate 8.6-50 MG tablet Commonly known as: Senokot-S Take 1 tablet by mouth 2 (two) times daily.        Procedures/Studies: DG Chest 1 View  Result Date: 03/12/2022 CLINICAL DATA:  Aspiration EXAM: CHEST  1 VIEW COMPARISON:  03/01/2018 FINDINGS: The heart size and mediastinal contours are within normal limits. Aortic atherosclerosis. Low lung volumes. No focal airspace consolidation, pleural effusion, or pneumothorax. The visualized skeletal structures are unremarkable. IMPRESSION: No active disease. Electronically Signed   By: Davina Poke D.O.   On: 03/12/2022 16:08   NM Hepato W/EF  Result Date: 03/11/2022 CLINICAL DATA:  Abdominal pain, cholelithiasis, question acute cholecystitis EXAM: NUCLEAR MEDICINE HEPATOBILIARY IMAGING TECHNIQUE: Sequential images of the abdomen were obtained out to 60 minutes following intravenous administration of radiopharmaceutical. RADIOPHARMACEUTICALS:  6.7 mCi Tc-43mCholetec IV (5.5 mCi + 1.2 mCi) COMPARISON:  CT abdomen and pelvis and ultrasound abdomen 03/10/2022 FINDINGS: Normal tracer extraction from bloodstream indicating normal hepatocellular function. Prompt excretion of tracer into biliary tree. Small bowel visualized by 19 minutes. At 1 hour, gallbladder had not visualized. Patient was reinjected with additional tracer and  3 mg of morphine IV. Imaging for 30 minutes fails to demonstrate tracer within the gallbladder. Findings are consistent with cystic duct obstruction and acute cholecystitis. IMPRESSION: Nonvisualization of the gallbladder despite morphine augmentation consistent with acute cholecystitis. Patent CBD. Electronically Signed   By: Lavonia Dana M.D.   On: 03/11/2022 13:48   MR ABDOMEN W WO CONTRAST  Result Date: 03/11/2022 CLINICAL DATA:  Splenic mass identified by CT EXAM: MRI ABDOMEN WITHOUT AND WITH CONTRAST TECHNIQUE: Multiplanar multisequence MR imaging of the abdomen was performed both before and after the  administration of intravenous contrast. CONTRAST:  65m GADAVIST GADOBUTROL 1 MMOL/ML IV SOLN COMPARISON:  CT abdomen pelvis, 03/10/2022 FINDINGS: Lower chest: No acute abnormality. Hepatobiliary: No solid liver abnormality is seen. Numerous tiny gallstones in the dependent gallbladder (series 4, image 24). Similar pericholecystic edema (series 3, image 16). No biliary ductal dilatation. No gallstones identified to the ampulla. Pancreas: Unremarkable. No pancreatic ductal dilatation or surrounding inflammatory changes. Spleen: Spleen is enlarged by the presence of a mass, maximum coronal span 15.5 cm. Rounded, faintly circumscribed mass occupying essentially the entirety of the superior spleen is nearly isosignal to splenic parenchyma, minimally T2 hyperintense, with heterogeneous hypoenhancement to splenic parenchyma on multiphasic contrast enhanced sequences. This lesion measures 10.0 x 8.5 x 9.7 cm (series 4, image 16, series 3, image 22). Adrenals/Urinary Tract: Definitively benign, macroscopic fat containing right adrenal adenoma, for which no further follow-up or characterization is required (series 12, image 25). Normal left adrenal gland. Kidneys are normal, without renal calculi, solid lesion, or hydronephrosis. Stomach/Bowel: Stomach is within normal limits. No evidence of bowel wall thickening, distention, or inflammatory changes. Vascular/Lymphatic: No significant vascular findings are present. No enlarged abdominal lymph nodes. Other: No abdominal wall hernia or abnormality. No ascites. Musculoskeletal: No acute or significant osseous findings. IMPRESSION: 1. Cholelithiasis with pericholecystic edema, again concerning for acute cholecystitis. No evidence of choledocholithiasis or biliary ductal dilatation. 2. Rounded, faintly circumscribed mass occupying essentially the entirety of the superior spleen, measuring 10.0 x 8.5 x 9.7 cm, with heterogeneous hypoenhancement to splenic parenchyma on multiphasic  contrast enhanced sequences. 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 Malignancy or metastatic disease can not however be excluded on the basis of imaging. Consider short interval follow-up in 3-6 months to ensure stability. 3. Definitively benign, macroscopic fat containing right adrenal adenoma, for which no further follow-up or characterization is required. Electronically Signed   By: ADelanna AhmadiM.D.   On: 03/11/2022 12:05   MR 3D Recon At Scanner  Result Date: 03/11/2022 CLINICAL DATA:  Splenic mass identified by CT EXAM: MRI ABDOMEN WITHOUT AND WITH CONTRAST TECHNIQUE: Multiplanar multisequence MR imaging of the abdomen was performed both before and after the administration of intravenous contrast. CONTRAST:  781mGADAVIST GADOBUTROL 1 MMOL/ML IV SOLN COMPARISON:  CT abdomen pelvis, 03/10/2022 FINDINGS: Lower chest: No acute abnormality. Hepatobiliary: No solid liver abnormality is seen. Numerous tiny gallstones in the dependent gallbladder (series 4, image 24). Similar pericholecystic edema (series 3, image 16). No biliary ductal dilatation. No gallstones identified to the ampulla. Pancreas: Unremarkable. No pancreatic ductal dilatation or surrounding inflammatory changes. Spleen: Spleen is enlarged by the presence of a mass, maximum coronal span 15.5 cm. Rounded, faintly circumscribed mass occupying essentially the entirety of the superior spleen is nearly isosignal to splenic parenchyma, minimally T2 hyperintense, with heterogeneous hypoenhancement to splenic parenchyma on multiphasic contrast enhanced sequences. This lesion measures 10.0 x  8.5 x 9.7 cm (series 4, image 16, series 3, image 22). Adrenals/Urinary Tract: Definitively benign, macroscopic fat containing right adrenal adenoma, for which no further follow-up or characterization is required (series 12, image 25). Normal left adrenal gland. Kidneys are  normal, without renal calculi, solid lesion, or hydronephrosis. Stomach/Bowel: Stomach is within normal limits. No evidence of bowel wall thickening, distention, or inflammatory changes. Vascular/Lymphatic: No significant vascular findings are present. No enlarged abdominal lymph nodes. Other: No abdominal wall hernia or abnormality. No ascites. Musculoskeletal: No acute or significant osseous findings. IMPRESSION: 1. Cholelithiasis with pericholecystic edema, again concerning for acute cholecystitis. No evidence of choledocholithiasis or biliary ductal dilatation. 2. Rounded, faintly circumscribed mass occupying essentially the entirety of the superior spleen, measuring 10.0 x 8.5 x 9.7 cm, with heterogeneous hypoenhancement to splenic parenchyma on multiphasic contrast enhanced sequences. 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 Malignancy or metastatic disease can not however be excluded on the basis of imaging. Consider short interval follow-up in 3-6 months to ensure stability. 3. Definitively benign, macroscopic fat containing right adrenal adenoma, for which no further follow-up or characterization is required. Electronically Signed   By: Delanna Ahmadi M.D.   On: 03/11/2022 12:05   US Abdomen Limited  Result Date: 03/10/2022 CLINICAL DATA:  Mid abdominal pain. Findings concerning for acute cholecystitis on CT imaging. EXAM: ULTRASOUND ABDOMEN LIMITED RIGHT UPPER QUADRANT COMPARISON:  CT of the abdomen and pelvis March 10, 2022 FINDINGS: Gallbladder: Numerous tiny stones in the gallbladder without wall thickening, pericholecystic fluid, or Murphy's sign. Common bile duct: Diameter: 3.5 mm Liver: No focal lesion identified. Within normal limits in parenchymal echogenicity. Portal vein is patent on color Doppler imaging with normal direction of blood flow towards the liver. Other: None. IMPRESSION: 1. Cholelithiasis  without wall thickening, pericholecystic fluid, or Murphy's sign. On the CT scan from earlier today, there is distension of the gallbladder and mild increased attenuation in the pericholecystic fat. As result, the findings on the ultrasound do not demonstrate evidence of acute cholecystitis but the findings on CT imaging are concerning for acute cholecystitis. Recommend clinical correlation. If the clinical picture remains ambiguous, recommend a HIDA scan for further evaluation. 2. No other abnormalities. Electronically Signed   By: Dorise Bullion III M.D.   On: 03/10/2022 10:24   CT ABDOMEN PELVIS W CONTRAST  Result Date: 03/10/2022 CLINICAL DATA:  Generalized abdominal pain with nausea. EXAM: CT ABDOMEN AND PELVIS WITH CONTRAST TECHNIQUE: Multidetector CT imaging of the abdomen and pelvis was performed using the standard protocol following bolus administration of intravenous contrast. RADIATION DOSE REDUCTION: This exam was performed according to the departmental dose-optimization program which includes automated exposure control, adjustment of the mA and/or kV according to patient size and/or use of iterative reconstruction technique. CONTRAST:  168m OMNIPAQUE IOHEXOL 300 MG/ML  SOLN COMPARISON:  None Available. FINDINGS: Lower chest: Lung bases show posterior atelectasis but no infiltrates. There is mild cardiomegaly. No pericardial effusion. Small to moderate size hiatal hernia. Hepatobiliary: The liver is 20 cm length slightly steatotic. There is no mass enhancement. The gallbladder is distended up to 10 cm with tiny stones layering posteriorly and there is slight gallbladder wall thickening and pericholecystic edema concerning for acute cholecystitis. There is no biliary dilatation. Pancreas: Unremarkable. Spleen: On the first series postcontrast there is a 9.1 x 7.8 x 9 cm heterogeneous mass in the superomedial aspect of the spleen. In the delayed images this  is more uniform in attenuation suggesting  this may represent a hemangioma. MRI without and with contrast is recommended for further evaluation. The spleen mildly enlarged overall at 14.7 cm in length without other focal abnormality. Adrenals/Urinary Tract: Heterogeneous right adrenal mass of 2.9 x 1.8 cm 91 Hounsfield units. The left adrenal gland are unremarkable. There is a 1.2 cm cyst in the superior pole of the right kidney of 10.2 Hounsfield units and a 1.1 cm cyst in the extreme inferior pole of this kidney 7.6 Hounsfield units. There are few tiny right renal cortical hypodensities which are too small to characterize. The left kidney is unremarkable. There is no calculus or urinary obstruction. The ureters insert low in the pelvis consistent with pelvic floor laxity but no cystocele is seen. The bladder is unremarkable. Stomach/Bowel: No dilatation or wall thickening. An appendix is not seen in this patient. Vascular/Lymphatic: There is moderately heavy aortoiliac calcific plaque. No AAA or dissection. No critical stenosis. Reproductive: Status post hysterectomy. No adnexal masses. Other: Mild umbilical rectus diastasis and small umbilical fat hernia. There is no incarcerated hernia. There is no free air, free hemorrhage or free fluid. Musculoskeletal: There is levoscoliosis, degenerative change in osteopenia lumbar spine. Mild hip DJD. Incidentally noted 7.5 x 3.7 by 9.2 cm right gluteus minimis muscle lipoma. Severe acquired spinal stenosis L4-5 due to disc osteophyte complex, ligamentous and facet hypertrophy. IMPRESSION: 1. Cholelithiasis and findings concerning for acute cholecystitis. No biliary dilatation. 2. 9.1 x 7.8 x 9 cm heterogeneous splenic mass on the initial images, more homogeneous attenuation in the delayed phase. 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 MRI without and with contrast recommended to confirm a benign entity. 3. 2.9 x 1.8 cm heterogeneous right adrenal mass. Recommend biochemical lab evaluation for pheochromocytoma. If lab values normal,  recommend adrenal washout CT or chemical shift MR. JACR 2017 Aug; 14(8):1038-44, JCAT 2016 Mar-Apr; 40(2):194-200, Urol J 2006 Spring; 3(2):71-4. 4. Small right renal cysts and additional hypodensities which are too small to characterize. No follow-up imaging is recommended. JACR 2018 Feb; 264-273, Management of the Incidental RenalMass on CT, [employer] 2021; 814-848, Bosniak Classification of Cystic Renal Masses, Version 2019. 5. Small/moderate size hiatal hernia. Umbilical rectus diastasis and small umbilical fat hernia. 6. Cardiomegaly, with aortic atherosclerosis. 7. Sizable right gluteus minimus lipoma. Osteopenia and degenerative change. 8. Pelvic floor laxity without evidence of cystocele 9. Severe acquired spinal stenosis L4-5. Electronically Signed   By: Telford Nab M.D.   On: 03/10/2022 00:41     Subjective: Pt tolerating diet well.  No SOB.  No cough, no chest congestion.  No abdominal pain.  Having bowel movements.   Discharge Exam: Vitals:   03/14/22 0824 03/14/22 0900  BP:  114/62  Pulse: (!) 59 65  Resp: 19 18  Temp: 98.4 F (36.9 C)   SpO2: 98% 96%   Vitals:   03/14/22 0600 03/14/22 0800 03/14/22 0824 03/14/22 0900  BP: (!) 116/47 (!) 104/46  114/62  Pulse: (!) 56 (!) 52 (!) 59 65  Resp: '20 20 19 18  '$ Temp:   98.4 F (36.9 C)   TempSrc:   Oral   SpO2: 94% 94% 98% 96%  Weight:      Height:       General: Pt is alert, awake, not in acute distress Cardiovascular: normal S1/S2 +, no rubs, no gallops Respiratory: CTA bilaterally, no wheezing, no rhonchi Abdominal: Soft, NT, ND, bowel sounds + Extremities: no edema, no cyanosis   The results of significant diagnostics from this hospitalization (  including imaging, microbiology, ancillary and laboratory) are listed below for reference.     Microbiology: Recent Results (from the past 240 hour(s))  MRSA Next Gen by PCR, Nasal     Status: None   Collection Time: 03/12/22  4:32 PM   Specimen: Nasal Mucosa; Nasal Swab   Result Value Ref Range Status   MRSA by PCR Next Gen NOT DETECTED NOT DETECTED Final    Comment: (NOTE) The GeneXpert MRSA Assay (FDA approved for NASAL specimens only), is one component of a comprehensive MRSA colonization surveillance program. It is not intended to diagnose MRSA infection nor to guide or monitor treatment for MRSA infections. Test performance is not FDA approved in patients less than 54 years old. Performed at [facility], [address] [place], [place] [address]      Labs: BNP (last 3 results) No results for input(s): "BNP" in the last 8760 hours. Basic Metabolic Panel: Recent Labs  Lab 03/10/22 0542 03/11/22 0909 03/12/22 0404 03/13/22 0305 03/14/22 0408  NA 137 137 138 138 138  K 3.8 3.3* 3.5 3.7 3.4*  CL 102 102 104 106 105  CO2 '27 27 27 25 26  '$ GLUCOSE 125* 103* 82 120* 88  BUN '9 12 16 13 13  '$ CREATININE 0.64 0.93 1.02* 0.89 0.99  CALCIUM 8.9 8.3* 7.9* 7.6* 7.5*  MG 1.8  --   --   --   --   PHOS 2.3*  --   --   --   --    Liver Function Tests: Recent Labs  Lab 03/10/22 0542 03/11/22 0909 03/12/22 0404 03/13/22 0305 03/14/22 0408  AST '15 20 18 '$ 46* 18  ALT '11 14 18 '$ 36 24  ALKPHOS 71 62 72 72 57  BILITOT 2.0* 3.9* 3.6* 2.9* 1.8*  PROT 7.5 6.6 5.9* 5.8* 5.2*  ALBUMIN 4.3 3.4* 3.1* 2.9* 2.6*   Recent Labs  Lab 03/09/22 1759  LIPASE 21   No results for input(s): "AMMONIA" in the last 168 hours. CBC: Recent Labs  Lab 03/10/22 0542 03/11/22 0909 03/12/22 0404 03/13/22 0305 03/14/22 0408  WBC 20.3* 17.8* 14.9* 16.2* 10.8*  HGB 13.9 12.5 11.3* 10.8* 8.8*  HCT 41.0 37.2 34.3* 32.9* 26.9*  MCV 85.4 84.7 86.6 86.6 87.6  PLT 232 218 194 206 180   Cardiac Enzymes: No results for input(s): "CKTOTAL", "CKMB", "CKMBINDEX", "TROPONINI" in the last 168 hours. BNP: Invalid input(s): "POCBNP" CBG: Recent Labs  Lab 03/12/22 1529  GLUCAP 145*   D-Dimer No results for input(s): "DDIMER" in the last 72 hours. Hgb A1c No results for  input(s): "HGBA1C" in the last 72 hours. Lipid Profile No results for input(s): "CHOL", "HDL", "LDLCALC", "TRIG", "CHOLHDL", "LDLDIRECT" in the last 72 hours. Thyroid function studies No results for input(s): "TSH", "T4TOTAL", "T3FREE", "THYROIDAB" in the last 72 hours.  Invalid input(s): "FREET3" Anemia work up No results for input(s): "VITAMINB12", "FOLATE", "FERRITIN", "TIBC", "IRON", "RETICCTPCT" in the last 72 hours. Urinalysis    Component Value Date/Time   COLORURINE YELLOW 03/10/2022 0037   APPEARANCEUR CLEAR 03/10/2022 0037   LABSPEC 1.031 (H) 03/10/2022 0037   PHURINE 7.0 03/10/2022 0037   GLUCOSEU NEGATIVE 03/10/2022 0037   HGBUR SMALL (A) 03/10/2022 0037   BILIRUBINUR NEGATIVE 03/10/2022 0037   KETONESUR NEGATIVE 03/10/2022 0037   PROTEINUR 30 (A) 03/10/2022 0037   UROBILINOGEN 1.0 08/16/2008 1005   NITRITE NEGATIVE 03/10/2022 0037   LEUKOCYTESUR NEGATIVE 03/10/2022 0037   Sepsis Labs Recent Labs  Lab 03/11/22 0909 03/12/22 0404 03/13/22  0305 03/14/22 0408  WBC 17.8* 14.9* 16.2* 10.8*   Microbiology Recent Results (from the past 240 hour(s))  MRSA Next Gen by PCR, Nasal     Status: None   Collection Time: 03/12/22  4:32 PM   Specimen: Nasal Mucosa; Nasal Swab  Result Value Ref Range Status   MRSA by PCR Next Gen NOT DETECTED NOT DETECTED Final    Comment: (NOTE) The GeneXpert MRSA Assay (FDA approved for NASAL specimens only), is one component of a comprehensive MRSA colonization surveillance program. It is not intended to diagnose MRSA infection nor to guide or monitor treatment for MRSA infections. Test performance is not FDA approved in patients less than 71 years old. Performed at [facility], [address] [place], [place] [address]     Time coordinating discharge: 36 mins  SIGNED:  Irwin Brakeman, MD  [facility] 03/14/2022, 1:27 PM How to contact the [facility] Attending or Consulting provider [place] or covering provider during after  hours [place], for this patient?  Check the care team in [facility] and look for a) attending/consulting [facility] provider listed and b) the [facility] team listed Log into [email or web address] and use 's universal password to access. If you do not have the password, please contact the hospital operator. Locate the [facility] provider you are looking for under [facility] and page to a number that you can be directly reached. If you still have difficulty reaching the provider, please page the [facility] (Director on Call) for the Hospitalists listed on amion for assistance.

## 2022-03-14 NOTE — Progress Notes (Signed)
Pharmacy Antibiotic Note  Abigail Frazier is a 81 y.o. female admitted on 03/09/2022 with  intra-abdominal infection .  Pharmacy has been consulted for Zosyn dosing.   Plan: Continue Zosyn 3.375G IV q8h to be infused over 4 hours   Height: '5\' 6"'$  (167.6 cm) Weight: 76.3 kg (168 lb 3.4 oz) IBW/kg (Calculated) : 59.3  Temp (24hrs), Avg:98.3 F (36.8 C), Min:97.7 F (36.5 C), Max:98.8 F (37.1 C)  Recent Labs  Lab 03/10/22 0542 03/11/22 0909 03/12/22 0404 03/13/22 0305 03/14/22 0408  WBC 20.3* 17.8* 14.9* 16.2* 10.8*  CREATININE 0.64 0.93 1.02* 0.89 0.99     Estimated Creatinine Clearance: 44.9 mL/min (by C-G formula based on SCr of 0.99 mg/dL).      zosyn 8/27 >>  Margot Ables, PharmD Clinical Pharmacist 03/14/2022 10:51 AM

## 2022-03-14 NOTE — Progress Notes (Signed)
[facility] Progress Note  2 Days Post-Op  Subjective: Patient seen and examined.  She is resting comfortably in bed.  She is tolerating her diet without nausea and vomiting.  She is having loose bowel movements.  She denies abdominal pain.  Objective: Vital signs in last 24 hours: Temp:  [97.7 F (36.5 C)-98.7 F (37.1 C)] 98.4 F (36.9 C) (08/31 0824) Pulse Rate:  [52-71] 65 (08/31 0900) Resp:  [18-28] 18 (08/31 0900) BP: (95-123)/(45-62) 114/62 (08/31 0900) SpO2:  [91 %-98 %] 96 % (08/31 0900) Last BM Date : 03/14/22  Intake/Output from previous day: 08/30 0701 - 08/31 0700 In: 422.4 [I.V.:422.4] Out: -  Intake/Output this shift: Total I/O In: -  Out: 200 [Urine:200]  General appearance: alert, cooperative, and no distress GI: Abdomen soft, nondistended, no percussion tenderness, non tender to palpation; no rigidity, guarding, or rebound tenderness; incisions C/D/I with dermabond in place  Lab Results:  Recent Labs    03/13/22 0305 03/14/22 0408  WBC 16.2* 10.8*  HGB 10.8* 8.8*  HCT 32.9* 26.9*  PLT 206 180   BMET Recent Labs    03/13/22 0305 03/14/22 0408  NA 138 138  K 3.7 3.4*  CL 106 105  CO2 25 26  GLUCOSE 120* 88  BUN 13 13  CREATININE 0.89 0.99  CALCIUM 7.6* 7.5*   PT/INR No results for input(s): "LABPROT", "INR" in the last 72 hours.  Studies/Results: DG Chest 1 View  Result Date: 03/12/2022 CLINICAL DATA:  Aspiration EXAM: CHEST  1 VIEW COMPARISON:  03/01/2018 FINDINGS: The heart size and mediastinal contours are within normal limits. Aortic atherosclerosis. Low lung volumes. No focal airspace consolidation, pleural effusion, or pneumothorax. The visualized skeletal structures are unremarkable. IMPRESSION: No active disease. Electronically Signed   By: Davina Poke D.O.   On: 03/12/2022 16:08    Anti-infectives: Anti-infectives (From admission, onward)    Start     Dose/Rate Route Frequency Ordered Stop   03/10/22  1000  piperacillin-tazobactam (ZOSYN) IVPB 3.375 g        3.375 g 12.5 mL/hr over 240 Minutes Intravenous Every 8 hours 03/10/22 0541     03/10/22 0200  piperacillin-tazobactam (ZOSYN) IVPB 3.375 g        3.375 g 100 mL/hr over 30 Minutes Intravenous  Once 03/10/22 0148 03/10/22 0256       Assessment/Plan:  Patient is an 81 year old female who was admitted with concern for acute cholecystitis.  Status post laparoscopic cholecystectomy with lysis of adhesions on 8/29   -WBC normalized today, 10.8 -Hemoglobin dropped to 8.8 from 10.8. Patient without tachycardia. I am not surprised for a drop given 300 cc EBL  -Continue IV Zosyn while inpatient -Total bilirubin downtrending, 1.9 from 2.9 -Soft diet -Scheduled Tylenol, as needed Roxicodone morphine -Continue bowel regimen with Senokot-S, MiraLAX, and Dulcolax suppositories -Patient denying any shortness of breath, recommend clinical monitoring given small aspiration event during intubation -Patient stable for discharge from a general surgery standpoint. Patient should follow up with me in 2 weeks -Appreciate hospitalist recommendations   LOS: 4 days    Marissa 03/14/2022

## 2022-03-15 ENCOUNTER — Encounter (HOSPITAL_COMMUNITY): Payer: Self-pay | Admitting: Surgery

## 2022-03-16 LAB — TYPE AND SCREEN
ABO/RH(D): O POS
Antibody Screen: NEGATIVE
Unit division: 0
Unit division: 0

## 2022-03-16 LAB — BPAM RBC
Blood Product Expiration Date: 202309272359
Blood Product Expiration Date: 202310032359
Unit Type and Rh: 5100
Unit Type and Rh: 5100

## 2022-03-27 ENCOUNTER — Encounter: Payer: Self-pay | Admitting: Surgery

## 2022-03-27 ENCOUNTER — Ambulatory Visit (INDEPENDENT_AMBULATORY_CARE_PROVIDER_SITE_OTHER): Payer: Medicare HMO | Admitting: Surgery

## 2022-03-27 VITALS — BP 116/77 | HR 84 | Temp 98.0°F | Resp 12 | Ht 66.0 in | Wt 159.0 lb

## 2022-03-27 DIAGNOSIS — Z09 Encounter for follow-up examination after completed treatment for conditions other than malignant neoplasm: Secondary | ICD-10-CM

## 2022-03-28 NOTE — Progress Notes (Signed)
[facility] Note   HPI:  81 y.o. Female presents to clinic for post-op follow-up status post laparoscopic cholecystectomy on 8/29 with associated admission for acute cholecystitis.  Since discharge from the hospital, she has been doing very well.  She is tolerating a diet without nausea and vomiting, though she does have decreased appetite.  She is moving her bowels without difficulty.  She denies any abdominal pain.  Her incisions have been healing well.  She denies any fevers or chills.  Review of Systems:  All other review of systems: otherwise negative   Vital Signs:  BP 116/77   Pulse 84   Temp 98 F (36.7 C) (Oral)   Resp 12   Ht '5\' 6"'$  (1.676 m)   Wt 159 lb (72.1 kg)   SpO2 97%   BMI 25.66 kg/m    Physical Exam:  Physical Exam Vitals reviewed.  Constitutional:      Appearance: Normal appearance.  Abdominal:     Comments: Abdomen soft, nondistended, no percussion tenderness, nontender to palpation; no rigidity, guarding, rebound tenderness; incisions healing well with Dermabond still in place  Neurological:     Mental Status: She is alert.    Laboratory studies: None  Imaging:  None  Pathology: A.   GALLBLADDER, CHOLECYSTECTOMY:  -    Acute necrotizing cholecystitis, calculous.  -    No malignancy identified.   Assessment:  81 y.o. yo Female who presents for follow-up status post laparoscopic cholecystectomy on 8/29 after admission for acute cholecystitis.  Plan:  -She has been doing very well since surgery, with adequate pain control, tolerating a diet, moving her bowels without issue -Advised her that she can use antibiotic ointment to help dissolve away the remaining skin glue -Follow up as needed  All of the above recommendations were discussed with the patient, and all of patient's questions were answered to her expressed satisfaction.  Graciella Freer, DO [facility] [address] [place], [place]  [address] [phone] (office)

## 2022-04-02 DIAGNOSIS — E785 Hyperlipidemia, unspecified: Secondary | ICD-10-CM | POA: Diagnosis not present

## 2022-04-02 DIAGNOSIS — I10 Essential (primary) hypertension: Secondary | ICD-10-CM | POA: Diagnosis not present

## 2022-07-02 DIAGNOSIS — E785 Hyperlipidemia, unspecified: Secondary | ICD-10-CM | POA: Diagnosis not present

## 2022-07-02 DIAGNOSIS — I10 Essential (primary) hypertension: Secondary | ICD-10-CM | POA: Diagnosis not present

## 2022-07-09 ENCOUNTER — Other Ambulatory Visit (HOSPITAL_COMMUNITY)
Admission: RE | Admit: 2022-07-09 | Discharge: 2022-07-09 | Disposition: A | Discharge status: Home / Self Care | Payer: Medicare HMO | Source: Ambulatory Visit | Attending: Family Medicine | Admitting: Family Medicine

## 2022-07-09 DIAGNOSIS — R222 Localized swelling, mass and lump, trunk: Secondary | ICD-10-CM | POA: Insufficient documentation

## 2022-07-09 DIAGNOSIS — L720 Epidermal cyst: Secondary | ICD-10-CM | POA: Diagnosis not present

## 2022-07-09 DIAGNOSIS — L723 Sebaceous cyst: Secondary | ICD-10-CM | POA: Diagnosis not present

## 2022-07-11 LAB — SURGICAL PATHOLOGY

## 2022-10-29 DIAGNOSIS — E785 Hyperlipidemia, unspecified: Secondary | ICD-10-CM | POA: Diagnosis not present

## 2022-10-29 DIAGNOSIS — I10 Essential (primary) hypertension: Secondary | ICD-10-CM | POA: Diagnosis not present

## 2022-10-29 DIAGNOSIS — M170 Bilateral primary osteoarthritis of knee: Secondary | ICD-10-CM | POA: Diagnosis not present

## 2022-10-29 DIAGNOSIS — N390 Urinary tract infection, site not specified: Secondary | ICD-10-CM | POA: Diagnosis not present

## 2022-10-29 DIAGNOSIS — E7800 Pure hypercholesterolemia, unspecified: Secondary | ICD-10-CM | POA: Diagnosis not present

## 2023-01-17 DIAGNOSIS — M79601 Pain in right arm: Secondary | ICD-10-CM | POA: Diagnosis not present

## 2023-01-17 DIAGNOSIS — I10 Essential (primary) hypertension: Secondary | ICD-10-CM | POA: Diagnosis not present

## 2023-01-17 DIAGNOSIS — M19011 Primary osteoarthritis, right shoulder: Secondary | ICD-10-CM | POA: Diagnosis not present

## 2023-01-17 DIAGNOSIS — Z79899 Other long term (current) drug therapy: Secondary | ICD-10-CM | POA: Diagnosis not present

## 2023-01-17 DIAGNOSIS — M25511 Pain in right shoulder: Secondary | ICD-10-CM | POA: Diagnosis not present

## 2023-01-17 DIAGNOSIS — M159 Polyosteoarthritis, unspecified: Secondary | ICD-10-CM | POA: Diagnosis not present

## 2023-01-17 DIAGNOSIS — K219 Gastro-esophageal reflux disease without esophagitis: Secondary | ICD-10-CM | POA: Diagnosis not present

## 2023-01-21 DIAGNOSIS — M25511 Pain in right shoulder: Secondary | ICD-10-CM | POA: Diagnosis not present

## 2023-01-21 DIAGNOSIS — M19011 Primary osteoarthritis, right shoulder: Secondary | ICD-10-CM | POA: Diagnosis not present

## 2023-01-21 DIAGNOSIS — I10 Essential (primary) hypertension: Secondary | ICD-10-CM | POA: Diagnosis not present

## 2023-02-12 DIAGNOSIS — I10 Essential (primary) hypertension: Secondary | ICD-10-CM | POA: Diagnosis not present

## 2023-02-12 DIAGNOSIS — M170 Bilateral primary osteoarthritis of knee: Secondary | ICD-10-CM | POA: Diagnosis not present

## 2023-03-11 DIAGNOSIS — E785 Hyperlipidemia, unspecified: Secondary | ICD-10-CM | POA: Diagnosis not present

## 2023-03-11 DIAGNOSIS — I10 Essential (primary) hypertension: Secondary | ICD-10-CM | POA: Diagnosis not present

## 2023-03-11 DIAGNOSIS — M150 Primary generalized (osteo)arthritis: Secondary | ICD-10-CM | POA: Diagnosis not present

## 2023-04-02 DIAGNOSIS — S8991XA Unspecified injury of right lower leg, initial encounter: Secondary | ICD-10-CM | POA: Diagnosis not present

## 2023-06-24 DIAGNOSIS — M170 Bilateral primary osteoarthritis of knee: Secondary | ICD-10-CM | POA: Diagnosis not present

## 2023-06-24 DIAGNOSIS — E7800 Pure hypercholesterolemia, unspecified: Secondary | ICD-10-CM | POA: Diagnosis not present

## 2023-06-24 DIAGNOSIS — I10 Essential (primary) hypertension: Secondary | ICD-10-CM | POA: Diagnosis not present

## 2023-06-24 DIAGNOSIS — M150 Primary generalized (osteo)arthritis: Secondary | ICD-10-CM | POA: Diagnosis not present

## 2023-08-18 DIAGNOSIS — H401133 Primary open-angle glaucoma, bilateral, severe stage: Secondary | ICD-10-CM | POA: Diagnosis not present

## 2023-08-18 DIAGNOSIS — H524 Presbyopia: Secondary | ICD-10-CM | POA: Diagnosis not present

## 2023-08-18 DIAGNOSIS — H52223 Regular astigmatism, bilateral: Secondary | ICD-10-CM | POA: Diagnosis not present

## 2023-08-20 DIAGNOSIS — U071 COVID-19: Secondary | ICD-10-CM | POA: Diagnosis not present

## 2023-09-29 DIAGNOSIS — H401133 Primary open-angle glaucoma, bilateral, severe stage: Secondary | ICD-10-CM | POA: Diagnosis not present

## 2023-09-29 DIAGNOSIS — Z0100 Encounter for examination of eyes and vision without abnormal findings: Secondary | ICD-10-CM | POA: Diagnosis not present

## 2023-10-21 DIAGNOSIS — I10 Essential (primary) hypertension: Secondary | ICD-10-CM | POA: Diagnosis not present

## 2023-10-21 DIAGNOSIS — M150 Primary generalized (osteo)arthritis: Secondary | ICD-10-CM | POA: Diagnosis not present

## 2023-10-21 DIAGNOSIS — R001 Bradycardia, unspecified: Secondary | ICD-10-CM | POA: Diagnosis not present

## 2023-10-21 DIAGNOSIS — E7800 Pure hypercholesterolemia, unspecified: Secondary | ICD-10-CM | POA: Diagnosis not present

## 2023-10-21 DIAGNOSIS — M159 Polyosteoarthritis, unspecified: Secondary | ICD-10-CM | POA: Diagnosis not present

## 2023-11-14 ENCOUNTER — Telehealth: Payer: Self-pay

## 2023-11-14 NOTE — Progress Notes (Signed)
   11/14/2023  Patient ID: Abigail Frazier, female   DOB: 04-01-1941, 83 y.o.   MRN: [id number]  Contacted patient regarding medication adherence from a quality report for [facility]. The patient is in [facility] for 2025.   Left patient a voicemail to return my call at their convenience.  Livia Riffle, PharmD Clinical Pharmacist  [phone]

## 2024-01-12 DIAGNOSIS — Z6829 Body mass index (BMI) 29.0-29.9, adult: Secondary | ICD-10-CM | POA: Diagnosis not present

## 2024-01-12 DIAGNOSIS — J189 Pneumonia, unspecified organism: Secondary | ICD-10-CM | POA: Diagnosis not present

## 2024-01-12 DIAGNOSIS — I10 Essential (primary) hypertension: Secondary | ICD-10-CM | POA: Diagnosis not present

## 2024-01-12 DIAGNOSIS — R059 Cough, unspecified: Secondary | ICD-10-CM | POA: Diagnosis not present

## 2024-01-12 DIAGNOSIS — E663 Overweight: Secondary | ICD-10-CM | POA: Diagnosis not present

## 2024-01-19 DIAGNOSIS — J189 Pneumonia, unspecified organism: Secondary | ICD-10-CM | POA: Diagnosis not present

## 2024-01-19 DIAGNOSIS — E7800 Pure hypercholesterolemia, unspecified: Secondary | ICD-10-CM | POA: Diagnosis not present

## 2024-01-19 DIAGNOSIS — I10 Essential (primary) hypertension: Secondary | ICD-10-CM | POA: Diagnosis not present

## 2024-01-26 DIAGNOSIS — J189 Pneumonia, unspecified organism: Secondary | ICD-10-CM | POA: Diagnosis not present

## 2024-04-29 DIAGNOSIS — Z833 Family history of diabetes mellitus: Secondary | ICD-10-CM | POA: Diagnosis not present

## 2024-04-29 DIAGNOSIS — K219 Gastro-esophageal reflux disease without esophagitis: Secondary | ICD-10-CM | POA: Diagnosis not present

## 2024-04-29 DIAGNOSIS — I10 Essential (primary) hypertension: Secondary | ICD-10-CM | POA: Diagnosis not present

## 2024-04-29 DIAGNOSIS — E785 Hyperlipidemia, unspecified: Secondary | ICD-10-CM | POA: Diagnosis not present

## 2024-04-29 DIAGNOSIS — H409 Unspecified glaucoma: Secondary | ICD-10-CM | POA: Diagnosis not present

## 2024-04-29 DIAGNOSIS — Z8249 Family history of ischemic heart disease and other diseases of the circulatory system: Secondary | ICD-10-CM | POA: Diagnosis not present

## 2024-06-15 DIAGNOSIS — L309 Dermatitis, unspecified: Secondary | ICD-10-CM | POA: Diagnosis not present

## 2024-06-15 DIAGNOSIS — E669 Obesity, unspecified: Secondary | ICD-10-CM | POA: Diagnosis not present

## 2024-06-15 DIAGNOSIS — B379 Candidiasis, unspecified: Secondary | ICD-10-CM | POA: Diagnosis not present

## 2024-06-15 DIAGNOSIS — Z6830 Body mass index (BMI) 30.0-30.9, adult: Secondary | ICD-10-CM | POA: Diagnosis not present

## 2024-08-17 ENCOUNTER — Encounter: Payer: Self-pay | Admitting: *Deleted
